# Patient Record
Sex: Female | Born: 1950 | Hispanic: Yes | Marital: Married | Smoking: Never smoker
Health system: Southern US, Community
[De-identification: ages and names within clinical notes are randomized; demographics above are authoritative.]

## PROBLEM LIST (undated history)

## (undated) DIAGNOSIS — F419 Anxiety disorder, unspecified: Secondary | ICD-10-CM

## (undated) DIAGNOSIS — I10 Essential (primary) hypertension: Secondary | ICD-10-CM

## (undated) DIAGNOSIS — E7800 Pure hypercholesterolemia, unspecified: Secondary | ICD-10-CM

## (undated) DIAGNOSIS — K219 Gastro-esophageal reflux disease without esophagitis: Secondary | ICD-10-CM

## (undated) DIAGNOSIS — Z923 Personal history of irradiation: Secondary | ICD-10-CM

## (undated) DIAGNOSIS — E785 Hyperlipidemia, unspecified: Secondary | ICD-10-CM

## (undated) HISTORY — DX: Essential (primary) hypertension: I10

## (undated) HISTORY — PX: TUBAL LIGATION: SHX77

## (undated) HISTORY — PX: CATARACT EXTRACTION, BILATERAL: SHX1313

## (undated) HISTORY — DX: Pure hypercholesterolemia, unspecified: E78.00

## (undated) HISTORY — DX: Gastro-esophageal reflux disease without esophagitis: K21.9

---

## 2005-05-25 ENCOUNTER — Ambulatory Visit: Payer: Self-pay | Admitting: Family Medicine

## 2006-06-23 ENCOUNTER — Ambulatory Visit: Payer: Self-pay | Admitting: Family Medicine

## 2007-02-02 ENCOUNTER — Ambulatory Visit: Payer: Self-pay | Admitting: Family Medicine

## 2007-04-28 ENCOUNTER — Ambulatory Visit: Payer: Self-pay | Admitting: Unknown Physician Specialty

## 2008-02-29 ENCOUNTER — Ambulatory Visit: Payer: Self-pay | Admitting: Internal Medicine

## 2009-02-11 ENCOUNTER — Ambulatory Visit: Payer: Self-pay | Admitting: Internal Medicine

## 2010-02-12 ENCOUNTER — Ambulatory Visit: Payer: Self-pay | Admitting: Internal Medicine

## 2011-02-16 ENCOUNTER — Ambulatory Visit: Payer: Self-pay | Admitting: Internal Medicine

## 2012-03-02 ENCOUNTER — Ambulatory Visit: Payer: Self-pay | Admitting: Internal Medicine

## 2013-03-20 ENCOUNTER — Ambulatory Visit: Payer: Self-pay | Admitting: Internal Medicine

## 2014-03-11 DIAGNOSIS — I10 Essential (primary) hypertension: Secondary | ICD-10-CM

## 2014-03-11 HISTORY — DX: Essential (primary) hypertension: I10

## 2014-03-25 ENCOUNTER — Ambulatory Visit: Payer: Self-pay | Admitting: Internal Medicine

## 2015-01-24 ENCOUNTER — Other Ambulatory Visit: Payer: Self-pay | Admitting: Internal Medicine

## 2015-01-24 DIAGNOSIS — Z1231 Encounter for screening mammogram for malignant neoplasm of breast: Secondary | ICD-10-CM

## 2015-03-31 ENCOUNTER — Ambulatory Visit
Admission: RE | Admit: 2015-03-31 | Discharge: 2015-03-31 | Disposition: A | Discharge status: Home / Self Care | Payer: BLUE CROSS/BLUE SHIELD | Source: Ambulatory Visit | Attending: Internal Medicine | Admitting: Internal Medicine

## 2015-03-31 DIAGNOSIS — Z1231 Encounter for screening mammogram for malignant neoplasm of breast: Secondary | ICD-10-CM | POA: Diagnosis not present

## 2015-05-21 DIAGNOSIS — K219 Gastro-esophageal reflux disease without esophagitis: Secondary | ICD-10-CM

## 2015-05-21 HISTORY — DX: Gastro-esophageal reflux disease without esophagitis: K21.9

## 2016-02-20 ENCOUNTER — Other Ambulatory Visit: Payer: Self-pay | Admitting: Internal Medicine

## 2016-02-23 ENCOUNTER — Other Ambulatory Visit: Payer: Self-pay | Admitting: Internal Medicine

## 2016-02-23 DIAGNOSIS — Z1231 Encounter for screening mammogram for malignant neoplasm of breast: Secondary | ICD-10-CM

## 2016-04-01 ENCOUNTER — Other Ambulatory Visit: Payer: Self-pay | Admitting: Internal Medicine

## 2016-04-01 ENCOUNTER — Ambulatory Visit
Admission: RE | Admit: 2016-04-01 | Discharge: 2016-04-01 | Disposition: A | Discharge status: Home / Self Care | Payer: BLUE CROSS/BLUE SHIELD | Source: Ambulatory Visit | Attending: Internal Medicine | Admitting: Internal Medicine

## 2016-04-01 DIAGNOSIS — Z1231 Encounter for screening mammogram for malignant neoplasm of breast: Secondary | ICD-10-CM | POA: Insufficient documentation

## 2016-09-04 ENCOUNTER — Encounter: Payer: Self-pay | Admitting: Emergency Medicine

## 2016-09-04 ENCOUNTER — Emergency Department
Admission: EM | Admit: 2016-09-04 | Discharge: 2016-09-04 | Disposition: A | Discharge status: Home / Self Care | Payer: BLUE CROSS/BLUE SHIELD | Attending: Emergency Medicine | Admitting: Emergency Medicine

## 2016-09-04 DIAGNOSIS — I10 Essential (primary) hypertension: Secondary | ICD-10-CM | POA: Insufficient documentation

## 2016-09-04 DIAGNOSIS — R1013 Epigastric pain: Secondary | ICD-10-CM | POA: Diagnosis not present

## 2016-09-04 DIAGNOSIS — R112 Nausea with vomiting, unspecified: Secondary | ICD-10-CM | POA: Insufficient documentation

## 2016-09-04 HISTORY — DX: Essential (primary) hypertension: I10

## 2016-09-04 LAB — COMPREHENSIVE METABOLIC PANEL WITH GFR
ALT: 16 U/L (ref 14–54)
AST: 24 U/L (ref 15–41)
Albumin: 4.4 g/dL (ref 3.5–5.0)
Alkaline Phosphatase: 84 U/L (ref 38–126)
Anion gap: 12 (ref 5–15)
BUN: 14 mg/dL (ref 6–20)
CO2: 27 mmol/L (ref 22–32)
Calcium: 9.7 mg/dL (ref 8.9–10.3)
Chloride: 99 mmol/L — ABNORMAL LOW (ref 101–111)
Creatinine, Ser: 0.69 mg/dL (ref 0.44–1.00)
GFR calc Af Amer: >60 mL/min
GFR calc non Af Amer: >60 mL/min
Glucose, Bld: 133 mg/dL — ABNORMAL HIGH (ref 65–99)
Potassium: 3.6 mmol/L (ref 3.5–5.1)
Sodium: 138 mmol/L (ref 135–145)
Total Bilirubin: 0.4 mg/dL (ref 0.3–1.2)
Total Protein: 8.4 g/dL — ABNORMAL HIGH (ref 6.5–8.1)

## 2016-09-04 LAB — URINALYSIS COMPLETE WITH MICROSCOPIC (ARMC ONLY)
BILIRUBIN URINE: NEGATIVE
GLUCOSE, UA: NEGATIVE mg/dL
HGB URINE DIPSTICK: NEGATIVE
Nitrite: NEGATIVE
PH: 8 (ref 5.0–8.0)
Protein, ur: 100 mg/dL — AB
Specific Gravity, Urine: 1.014 (ref 1.005–1.030)

## 2016-09-04 LAB — CBC
HCT: 41.6 % (ref 35.0–47.0)
Hemoglobin: 14.7 g/dL (ref 12.0–16.0)
MCH: 31.5 pg (ref 26.0–34.0)
MCHC: 35.3 g/dL (ref 32.0–36.0)
MCV: 89.4 fL (ref 80.0–100.0)
PLATELETS: 329 10*3/uL (ref 150–440)
RBC: 4.65 MIL/uL (ref 3.80–5.20)
RDW: 13.8 % (ref 11.5–14.5)
WBC: 8.8 10*3/uL (ref 3.6–11.0)

## 2016-09-04 LAB — TROPONIN I: Troponin I: <0.03 ng/mL (ref <0.03)

## 2016-09-04 LAB — LIPASE, BLOOD: Lipase: 18 U/L (ref 11–51)

## 2016-09-04 MED ORDER — ONDANSETRON 4 MG PO TBDP: 4.0000 mg | ORAL_TABLET | Freq: Three times a day (TID) | ORAL | 0 refills | Status: DC | PRN

## 2016-09-04 MED ORDER — ONDANSETRON 4 MG PO TBDP
4.0000 mg | ORAL_TABLET | Freq: Once | ORAL | Status: AC | PRN
  Administered 2016-09-04: 4 mg via ORAL
  Filled 2016-09-04: qty 1

## 2016-09-04 MED ORDER — MORPHINE SULFATE (PF) 4 MG/ML IV SOLN
4.0000 mg | Freq: Once | INTRAVENOUS | Status: AC
  Administered 2016-09-04: 4 mg via INTRAVENOUS
  Filled 2016-09-04: qty 1

## 2016-09-04 MED ORDER — ONDANSETRON HCL 4 MG/2ML IJ SOLN
4.0000 mg | Freq: Once | INTRAMUSCULAR | Status: AC
  Administered 2016-09-04: 4 mg via INTRAVENOUS
  Filled 2016-09-04: qty 2

## 2016-09-04 MED ORDER — SODIUM CHLORIDE 0.9 % IV BOLUS (SEPSIS)
1000.0000 mL | Freq: Once | INTRAVENOUS | Status: AC
  Administered 2016-09-04: 1000 mL via INTRAVENOUS

## 2016-09-04 NOTE — ED Triage Notes (Signed)
Pt does report some shortness of breath at times that comes and goes.

## 2016-09-04 NOTE — ED Provider Notes (Addendum)
[facility] Emergency Department Provider Note  Time seen: 8:20 PM  I have reviewed the triage vital signs and the nursing notes.   HISTORY  Chief Complaint Abdominal Pain and Nausea    HPI Rebekah Kelly is a 65 y.o. female with a past medical history of hypertension who presents the emergency department with with upper abdominal discomfort nausea and vomiting. According to the patient around 2 or 3 PM this afternoon the patient developed upper abdominal discomfort, became nauseated and had an episode of vomiting. States she continued to feel somewhat nauseated so she came to the emergency department for evaluation. States the pain has subsided, she continues to feel mildly nauseated here denies any fever. Denies dysuria. Denies diarrhea. Patient states the pain is gone at this time.   Past Medical History:  Diagnosis Date  . Hypertension     There are no active problems to display for this patient.   History reviewed. No pertinent surgical history.  Prior to Admission medications   Not on File    No Known Allergies  Family History  Problem Relation Age of Onset  . Breast cancer Neg Hx     Social History Social History  Substance Use Topics  . Smoking status: Never Smoker  . Smokeless tobacco: Never Used  . Alcohol use No    Review of Systems Constitutional: Negative for fever. Cardiovascular: Negative for chest pain. Respiratory: Negative for shortness of breath. Gastrointestinal:Epigastric pain, now resolved. Positive for nausea. One episode of vomiting. Negative for diarrhea.  Genitourinary: Negative for dysuria. Neurological: Negative for headache 10-point ROS otherwise negative.  ____________________________________________   PHYSICAL EXAM:  VITAL SIGNS: ED Triage Vitals  Enc Vitals Group     BP 09/04/16 1723 (!) 199/107     Pulse Rate 09/04/16 1723 (!) 112     Resp 09/04/16 1723 (!) 22     Temp 09/04/16 1723 97.4 F (36.3  C)     Temp Source 09/04/16 1723 Oral     SpO2 09/04/16 1723 99 %     Weight 09/04/16 1723 166 lb (75.3 kg)     Height 09/04/16 1723 5\' 4"  (1.626 m)     Head Circumference --      Peak Flow --      Pain Score 09/04/16 1727 10     Pain Loc --      Pain Edu? --      Excl. in [place]? --     Constitutional: Alert and oriented. Well appearing and in no distress. Eyes: Normal exam ENT   Head: Normocephalic and atraumatic   Mouth/Throat: Mucous membranes are moist. Cardiovascular: Normal rate, regular rhythm. No murmur Respiratory: Normal respiratory effort without tachypnea nor retractions. Breath sounds are clear Gastrointestinal: Soft and nontender. No distention.   Musculoskeletal: Nontender with normal range of motion in all extremities.  Neurologic:  No gross focal neurologic deficits Skin:  Skin is warm, dry and intact.  Psychiatric: Mood and affect are normal.   ____________________________________________    INITIAL IMPRESSION / ASSESSMENT AND PLAN / ED COURSE  Pertinent labs & imaging results that were available during my care of the patient were reviewed by me and considered in my medical decision making (see chart for details).  The patient presents the emergency department epigastric discomfort nausea and vomiting. Hospital interpreter used for this evaluation. On exam the patient has a completely nontender abdomen with special attention. To the epigastrium and right upper quadrant without any pain or tenderness elicited.  Patient states the pain is gone, mild nausea. Patient denies any diarrhea. Denies any fever. Patient's labs are normal including lipase and LFTs. We will IV hydrate, treat with Zofran and closely monitor in the emergency department. I suspect the patient likely is experiencing a gastric intestinal illness possibly viral gastroenteritis.   EKG reviewed and interpreted, social sinus tachycardia 113 bpm, narrow QRS, normal axis, normal intervals besides a  slightly prolonged QTC of 507 ms, no concerning ST changes.  Patient states moderate headache, states the abdominal pain has completely resolved. We'll treat with pain medication, monitor closely in the emergency department. Plan to discharge home with PCP follow-up regarding her blood pressure. I discussed the plan of care with the patient with the interpreter present. ____________________________________________   FINAL CLINICAL IMPRESSION(S) / ED DIAGNOSES  Nausea and vomiting    Harvest Dark, MD 09/04/16 [id number]    Harvest Dark, MD 09/04/16 2204

## 2016-09-04 NOTE — ED Triage Notes (Signed)
Pt presents to ED with c/o epigastric abdominal pain that started around 330pm with reports of acid reflux.  Pt does not take any medications for GERD.  Pt reports vomiting 3x today.  Denies any diarrhea, but reports nausea.  Pt states she ate some spaghetti around 12pm.

## 2016-09-04 NOTE — ED Notes (Signed)
Interpreter requested 

## 2016-09-04 NOTE — Discharge Instructions (Signed)
Please take and nausea medication as needed, as prescribed. Please follow-up with your primary care doctor in the next 2-3 days for recheck/reevaluation. Return to the emergency department for any abdominal pain, fever, or any other symptom personally concerning to yourself.

## 2016-09-20 DIAGNOSIS — E7800 Pure hypercholesterolemia, unspecified: Secondary | ICD-10-CM

## 2016-09-20 HISTORY — DX: Pure hypercholesterolemia, unspecified: E78.00

## 2016-10-11 DIAGNOSIS — Z923 Personal history of irradiation: Secondary | ICD-10-CM

## 2016-10-11 HISTORY — DX: Personal history of irradiation: Z92.3

## 2017-02-25 ENCOUNTER — Encounter: Payer: Self-pay | Admitting: *Deleted

## 2017-02-28 ENCOUNTER — Ambulatory Visit: Payer: BLUE CROSS/BLUE SHIELD | Admitting: Anesthesiology

## 2017-02-28 ENCOUNTER — Encounter: Payer: Self-pay | Admitting: *Deleted

## 2017-02-28 ENCOUNTER — Encounter
Admission: RE | Disposition: A | Discharge status: Home / Self Care | Payer: Self-pay | Source: Ambulatory Visit | Attending: Unknown Physician Specialty

## 2017-02-28 ENCOUNTER — Ambulatory Visit
Admission: RE | Admit: 2017-02-28 | Discharge: 2017-02-28 | Disposition: A | Discharge status: Home / Self Care | Payer: BLUE CROSS/BLUE SHIELD | Source: Ambulatory Visit | Attending: Unknown Physician Specialty | Admitting: Unknown Physician Specialty

## 2017-02-28 DIAGNOSIS — I10 Essential (primary) hypertension: Secondary | ICD-10-CM | POA: Insufficient documentation

## 2017-02-28 DIAGNOSIS — Z9851 Tubal ligation status: Secondary | ICD-10-CM | POA: Insufficient documentation

## 2017-02-28 DIAGNOSIS — Z1211 Encounter for screening for malignant neoplasm of colon: Secondary | ICD-10-CM | POA: Insufficient documentation

## 2017-02-28 DIAGNOSIS — K648 Other hemorrhoids: Secondary | ICD-10-CM | POA: Insufficient documentation

## 2017-02-28 DIAGNOSIS — K449 Diaphragmatic hernia without obstruction or gangrene: Secondary | ICD-10-CM | POA: Insufficient documentation

## 2017-02-28 DIAGNOSIS — Z7982 Long term (current) use of aspirin: Secondary | ICD-10-CM | POA: Diagnosis not present

## 2017-02-28 DIAGNOSIS — K2950 Unspecified chronic gastritis without bleeding: Secondary | ICD-10-CM | POA: Insufficient documentation

## 2017-02-28 DIAGNOSIS — K219 Gastro-esophageal reflux disease without esophagitis: Secondary | ICD-10-CM | POA: Insufficient documentation

## 2017-02-28 DIAGNOSIS — E785 Hyperlipidemia, unspecified: Secondary | ICD-10-CM | POA: Diagnosis not present

## 2017-02-28 HISTORY — PX: ESOPHAGOGASTRODUODENOSCOPY (EGD) WITH PROPOFOL: SHX5813

## 2017-02-28 HISTORY — DX: Hyperlipidemia, unspecified: E78.5

## 2017-02-28 HISTORY — PX: COLONOSCOPY WITH PROPOFOL: SHX5780

## 2017-02-28 SURGERY — COLONOSCOPY WITH PROPOFOL
Anesthesia: General

## 2017-02-28 MED ORDER — GLYCOPYRROLATE 0.2 MG/ML IJ SOLN
INTRAMUSCULAR | Status: DC | PRN
  Administered 2017-02-28: 0.2 mg via INTRAVENOUS

## 2017-02-28 MED ORDER — PROPOFOL 500 MG/50ML IV EMUL
INTRAVENOUS | Status: DC | PRN
  Administered 2017-02-28: 150 ug/kg/min via INTRAVENOUS

## 2017-02-28 MED ORDER — LABETALOL HCL 5 MG/ML IV SOLN
INTRAVENOUS | Status: DC | PRN
  Administered 2017-02-28: 10 mg via INTRAVENOUS

## 2017-02-28 MED ORDER — SODIUM CHLORIDE 0.9 % IV SOLN
INTRAVENOUS | Status: DC
  Administered 2017-02-28 (×2): via INTRAVENOUS

## 2017-02-28 MED ORDER — GLYCOPYRROLATE 0.2 MG/ML IJ SOLN
INTRAMUSCULAR | Status: AC
  Filled 2017-02-28: qty 1

## 2017-02-28 MED ORDER — PROPOFOL 500 MG/50ML IV EMUL
INTRAVENOUS | Status: AC
  Filled 2017-02-28: qty 50

## 2017-02-28 MED ORDER — LIDOCAINE HCL (CARDIAC) 20 MG/ML IV SOLN
INTRAVENOUS | Status: DC | PRN
  Administered 2017-02-28: 4 mL via INTRAVENOUS

## 2017-02-28 MED ORDER — PROPOFOL 10 MG/ML IV BOLUS
INTRAVENOUS | Status: DC | PRN
  Administered 2017-02-28: 50 mg via INTRAVENOUS
  Administered 2017-02-28 (×2): 25 mg via INTRAVENOUS

## 2017-02-28 NOTE — Transfer of Care (Signed)
Immediate Anesthesia Transfer of Care Note  Patient: Rebekah Kelly  Procedure(s) Performed: Procedure(s): COLONOSCOPY WITH PROPOFOL (N/A) ESOPHAGOGASTRODUODENOSCOPY (EGD) WITH PROPOFOL (N/A)  Patient Location: PACU  Anesthesia Type:General  Level of Consciousness: sedated and responds to stimulation  Airway & Oxygen Therapy: Patient Spontanous Breathing and Patient connected to nasal cannula oxygen  Post-op Assessment: Report given to RN and Post -op Vital signs reviewed and stable  Post vital signs: Reviewed and stable  Last Vitals:  Vitals:   02/28/17 1247 02/28/17 1437  BP: (!) 252/106 122/61  Pulse: 94 99  Resp: 18 15  Temp: 37.2 C     Last Pain:  Vitals:   02/28/17 1247  TempSrc: Tympanic         Complications: No apparent anesthesia complications

## 2017-02-28 NOTE — Anesthesia Post-op Follow-up Note (Cosign Needed)
Anesthesia QCDR form completed.        

## 2017-02-28 NOTE — H&P (Signed)
   Primary Care Physician:  Tracie Harrier, MD Primary Gastroenterologist:  Dr. Vira Agar  Pre-Procedure History & Physical: HPI:  Rebekah Kelly is a 66 y.o. female is here for an endoscopy and colonoscopy.   Past Medical History:  Diagnosis Date  . Hyperlipidemia   . Hypertension     Past Surgical History:  Procedure Laterality Date  . TUBAL LIGATION      Prior to Admission medications   Medication Sig Start Date End Date Taking? Authorizing Provider  aspirin EC 81 MG tablet Take 81 mg by mouth daily.   Yes [provider]  lisinopril-hydrochlorothiazide (PRINZIDE,ZESTORETIC) 10-12.5 MG tablet Take 1 tablet by mouth daily.   Yes [provider]  ondansetron (ZOFRAN ODT) 4 MG disintegrating tablet Take 1 tablet (4 mg total) by mouth every 8 (eight) hours as needed for nausea or vomiting. Patient not taking: Reported on 02/28/2017 09/04/16   Harvest Dark, MD    Allergies as of 12/15/2016  . (No Known Allergies)    Family History  Problem Relation Age of Onset  . Breast cancer Neg Hx     Social History   Social History  . Marital status: Married    Spouse name: N/A  . Number of children: N/A  . Years of education: N/A   Occupational History  . Not on file.   Social History Main Topics  . Smoking status: Never Smoker  . Smokeless tobacco: Never Used  . Alcohol use No  . Drug use: No  . Sexual activity: Not on file   Other Topics Concern  . Not on file   Social History Narrative  . No narrative on file    Review of Systems: See HPI, otherwise negative ROS  Physical Exam: BP 140/82   Pulse 97   Temp (!) 96.5 F (35.8 C) (Tympanic)   Resp 11   Ht 5' (1.524 m)   Wt 75.8 kg (167 lb)   SpO2 100%   BMI 32.61 kg/m  General:   Alert,  pleasant and cooperative in NAD Head:  Normocephalic and atraumatic. Neck:  Supple; no masses or thyromegaly. Lungs:  Clear throughout to auscultation.    Heart:  Regular rate  and rhythm. Abdomen:  Soft, nontender and nondistended. Normal bowel sounds, without guarding, and without rebound.   Neurologic:  Alert and  oriented x4;  grossly normal neurologically.  Impression/Plan: Rebekah Kelly is here for an endoscopy and colonoscopy to be performed for screening and dyspepsia  Risks, benefits, limitations, and alternatives regarding  endoscopy and colonoscopy have been reviewed with the patient.  Questions have been answered.  All parties agreeable.   Gaylyn Cheers, MD  02/28/2017, 2:54 PM

## 2017-02-28 NOTE — Anesthesia Preprocedure Evaluation (Signed)
Anesthesia Evaluation  Patient identified by MRN, date of birth, ID band Patient awake    Reviewed: Allergy & Precautions, NPO status , Patient's Chart, lab work & pertinent test results  History of Anesthesia Complications Negative for: history of anesthetic complications  Airway Mallampati: III       Dental   Pulmonary neg pulmonary ROS,           Cardiovascular hypertension, Pt. on medications      Neuro/Psych negative neurological ROS     GI/Hepatic Neg liver ROS, GERD  ,  Endo/Other  negative endocrine ROS  Renal/GU negative Renal ROS     Musculoskeletal   Abdominal   Peds  Hematology   Anesthesia Other Findings   Reproductive/Obstetrics                             Anesthesia Physical Anesthesia Plan  ASA: II  Anesthesia Plan: General   Post-op Pain Management:    Induction: Intravenous  Airway Management Planned: Nasal Cannula  Additional Equipment:   Intra-op Plan:   Post-operative Plan:   Informed Consent: I have reviewed the patients History and Physical, chart, labs and discussed the procedure including the risks, benefits and alternatives for the proposed anesthesia with the patient or authorized representative who has indicated his/her understanding and acceptance.     Plan Discussed with:   Anesthesia Plan Comments:         Anesthesia Quick Evaluation

## 2017-02-28 NOTE — Op Note (Signed)
[facility] Gastroenterology Patient Name: Rebekah Kelly Procedure Date: 02/28/2017 2:04 PM MRN: [id number] Account #: [id number] Date of Birth: 1951/05/08 Admit Type: Outpatient Age: 66 Room: [facility] ENDO ROOM 1 Gender: Female Note Status: Finalized Procedure:            Colonoscopy Indications:          Screening for colorectal malignant neoplasm Providers:            Manya Silvas, MD Referring MD:         Tracie Harrier, MD (Referring MD) Medicines:            Propofol per Anesthesia Complications:        No immediate complications. Procedure:            Pre-Anesthesia Assessment:                       - After reviewing the risks and benefits, the patient                        was deemed in satisfactory condition to undergo the                        procedure.                       After obtaining informed consent, the colonoscope was                        passed under direct vision. Throughout the procedure,                        the patient's blood pressure, pulse, and oxygen                        saturations were monitored continuously. The Olympus                        PCF-H180AL colonoscope ( S#: [id number] ) was introduced                        through the anus and advanced to the the cecum,                        identified by appendiceal orifice and ileocecal valve.                        The colonoscopy was performed without difficulty. The                        patient tolerated the procedure well. The quality of                        the bowel preparation was adequate to identify polyps. Findings:      Internal hemorrhoids were found during endoscopy. The hemorrhoids were       small and Grade I (internal hemorrhoids that do not prolapse). Some       liquid stool was suctioned out of the colon, enough to get a good view       of all the colon. No polyps or inflammation seen anywhere. Impression:           -  Internal  hemorrhoids.                       - No specimens collected. Recommendation:       - Repeat colonoscopy in 10 years for screening purposes. Manya Silvas, MD 02/28/2017 2:38:42 PM This report has been signed electronically. Number of Addenda: 0 Note Initiated On: 02/28/2017 2:04 PM Scope Withdrawal Time: 0 hours 7 minutes 58 seconds  Total Procedure Duration: 0 hours 13 minutes 34 seconds       [facility]

## 2017-02-28 NOTE — Op Note (Signed)
[facility] Gastroenterology Patient Name: Rebekah Kelly Procedure Date: 02/28/2017 2:04 PM MRN: [id number] Account #: [id number] Date of Birth: 04-24-1951 Admit Type: Outpatient Age: 66 Room: [facility] ENDO ROOM 1 Gender: Female Note Status: Finalized Procedure:            Upper GI endoscopy Indications:          Heartburn Providers:            Manya Silvas, MD Referring MD:         Tracie Harrier, MD (Referring MD) Medicines:            Propofol per Anesthesia Complications:        No immediate complications. Procedure:            Pre-Anesthesia Assessment:                       - After reviewing the risks and benefits, the patient                        was deemed in satisfactory condition to undergo the                        procedure.                       After obtaining informed consent, the endoscope was                        passed under direct vision. Throughout the procedure,                        the patient's blood pressure, pulse, and oxygen                        saturations were monitored continuously. The Endoscope                        was introduced through the mouth, and advanced to the                        second part of duodenum. The upper GI endoscopy was                        accomplished without difficulty. The patient tolerated                        the procedure well. Findings:      The examined esophagus was normal.      A small hiatal hernia was present.      Diffuse mildly slightly erythematous mucosa without bleeding was found       in the gastric body. Biopsies were taken with a cold forceps for       Helicobacter pylori testing. Small submucosal nodule seen in proximal       body.      The examined duodenum was normal. Impression:           - Normal esophagus.                       - Small hiatal hernia.                       -  Erythematous mucosa in the gastric body. Biopsied.                       -  Normal examined duodenum. Recommendation:       - Await pathology results. Manya Silvas, MD 02/28/2017 2:19:02 PM This report has been signed electronically. Number of Addenda: 0 Note Initiated On: 02/28/2017 2:04 PM      [facility]

## 2017-03-02 LAB — SURGICAL PATHOLOGY

## 2017-03-03 ENCOUNTER — Encounter: Payer: Self-pay | Admitting: Unknown Physician Specialty

## 2017-03-03 NOTE — Anesthesia Postprocedure Evaluation (Signed)
Anesthesia Post Note  Patient: Nonie Lochner de Crespo  Procedure(s) Performed: Procedure(s) (LRB): COLONOSCOPY WITH PROPOFOL (N/A) ESOPHAGOGASTRODUODENOSCOPY (EGD) WITH PROPOFOL (N/A)  Patient location during evaluation: Endoscopy Anesthesia Type: General Level of consciousness: awake and alert Pain management: pain level controlled Vital Signs Assessment: post-procedure vital signs reviewed and stable Respiratory status: spontaneous breathing, nonlabored ventilation, respiratory function stable and patient connected to nasal cannula oxygen Cardiovascular status: blood pressure returned to baseline and stable Postop Assessment: no signs of nausea or vomiting Anesthetic complications: no     Last Vitals:  Vitals:   02/28/17 1505 02/28/17 1515  BP: (!) 157/83 (!) 154/75  Pulse: 92 90  Resp: 11 11  Temp:      Last Pain:  Vitals:   02/28/17 1437  TempSrc: Tympanic  PainSc: Asleep                 Martha Clan

## 2017-03-24 ENCOUNTER — Other Ambulatory Visit: Payer: Self-pay | Admitting: Internal Medicine

## 2017-03-24 DIAGNOSIS — Z1231 Encounter for screening mammogram for malignant neoplasm of breast: Secondary | ICD-10-CM

## 2017-04-20 ENCOUNTER — Ambulatory Visit
Admission: RE | Admit: 2017-04-20 | Discharge: 2017-04-20 | Disposition: A | Discharge status: Home / Self Care | Payer: BLUE CROSS/BLUE SHIELD | Source: Ambulatory Visit | Attending: Internal Medicine | Admitting: Internal Medicine

## 2017-04-20 DIAGNOSIS — Z1231 Encounter for screening mammogram for malignant neoplasm of breast: Secondary | ICD-10-CM | POA: Diagnosis not present

## 2017-04-26 ENCOUNTER — Other Ambulatory Visit: Payer: Self-pay | Admitting: Internal Medicine

## 2017-04-26 DIAGNOSIS — R921 Mammographic calcification found on diagnostic imaging of breast: Secondary | ICD-10-CM

## 2017-04-26 DIAGNOSIS — R928 Other abnormal and inconclusive findings on diagnostic imaging of breast: Secondary | ICD-10-CM

## 2017-04-28 ENCOUNTER — Ambulatory Visit
Admission: RE | Admit: 2017-04-28 | Discharge: 2017-04-28 | Disposition: A | Discharge status: Home / Self Care | Payer: BLUE CROSS/BLUE SHIELD | Source: Ambulatory Visit | Attending: Internal Medicine | Admitting: Internal Medicine

## 2017-04-28 ENCOUNTER — Other Ambulatory Visit: Payer: BLUE CROSS/BLUE SHIELD

## 2017-04-28 ENCOUNTER — Other Ambulatory Visit: Payer: Self-pay | Admitting: Internal Medicine

## 2017-04-28 DIAGNOSIS — R921 Mammographic calcification found on diagnostic imaging of breast: Secondary | ICD-10-CM

## 2017-04-28 DIAGNOSIS — R928 Other abnormal and inconclusive findings on diagnostic imaging of breast: Secondary | ICD-10-CM | POA: Diagnosis not present

## 2017-04-29 ENCOUNTER — Other Ambulatory Visit: Payer: Self-pay | Admitting: Internal Medicine

## 2017-04-29 DIAGNOSIS — R921 Mammographic calcification found on diagnostic imaging of breast: Secondary | ICD-10-CM

## 2017-04-29 DIAGNOSIS — R928 Other abnormal and inconclusive findings on diagnostic imaging of breast: Secondary | ICD-10-CM

## 2017-05-06 ENCOUNTER — Ambulatory Visit
Admission: RE | Admit: 2017-05-06 | Discharge: 2017-05-06 | Disposition: A | Discharge status: Home / Self Care | Payer: BLUE CROSS/BLUE SHIELD | Source: Ambulatory Visit | Attending: Internal Medicine | Admitting: Internal Medicine

## 2017-05-06 DIAGNOSIS — R921 Mammographic calcification found on diagnostic imaging of breast: Secondary | ICD-10-CM

## 2017-05-06 DIAGNOSIS — R928 Other abnormal and inconclusive findings on diagnostic imaging of breast: Secondary | ICD-10-CM

## 2017-05-06 DIAGNOSIS — D0511 Intraductal carcinoma in situ of right breast: Secondary | ICD-10-CM | POA: Diagnosis not present

## 2017-05-06 DIAGNOSIS — R920 Mammographic microcalcification found on diagnostic imaging of breast: Secondary | ICD-10-CM | POA: Insufficient documentation

## 2017-05-06 HISTORY — PX: BREAST BIOPSY: SHX20

## 2017-05-10 ENCOUNTER — Encounter: Payer: Self-pay | Admitting: *Deleted

## 2017-05-10 LAB — SURGICAL PATHOLOGY

## 2017-05-10 NOTE — Progress Notes (Signed)
  Oncology Nurse Navigator Documentation  Navigator Location: [facility] (05/10/17 1100)   )Navigator Encounter Type: Introductory phone call (05/10/17 1100)   Abnormal Finding Date: 04/28/17 (05/10/17 1100) Confirmed Diagnosis Date: 05/10/17 (05/10/17 1100)                   Barriers/Navigation Needs:  (language barrier) (05/10/17 1100)                          Time Spent with Patient: 15 (05/10/17 1100)   Patient is newly diagnosed with DCIS of the right breast.  Tried to call patient to establish navigation services via the interpreter, Ronnald Collum.  No answer, and no voicemail.  Will try again later.  Patient will need to be scheduled for a surgical consult.

## 2017-05-11 ENCOUNTER — Encounter: Payer: Self-pay | Admitting: *Deleted

## 2017-05-11 NOTE — Progress Notes (Signed)
  Oncology Nurse Navigator Documentation  Navigator Location: [facility] (05/11/17 1400)   )Navigator Encounter Type: Introductory phone call (05/11/17 1400)                         Barriers/Navigation Needs: Coordination of Care;Education (translation) (05/11/17 1400) Education: Coping with Diagnosis/ Prognosis;Newly Diagnosed Cancer Education (05/11/17 1400) Interventions: Coordination of Care (05/11/17 1400)   Coordination of Care: Appts (interpreter provided) (05/11/17 1400)                  Time Spent with Patient: 30 (05/11/17 1400)   Talked to patient today via [place], the interpreter.  Patient does not have a surgical preference.  She is planning to go to [place] for a trip on 05/18/17 and return on 05/30/17.  I have scheduled her an appointment to see Dr. Hampton Abbot at [facility] on 06/01/17 @ 9:00.  She is aware that I will leave educational literature at his office for her to pick up.  She is to call if she has any questions or needs.

## 2017-05-12 ENCOUNTER — Other Ambulatory Visit: Payer: Self-pay | Admitting: *Deleted

## 2017-05-24 ENCOUNTER — Other Ambulatory Visit: Payer: Self-pay

## 2017-06-01 ENCOUNTER — Ambulatory Visit (INDEPENDENT_AMBULATORY_CARE_PROVIDER_SITE_OTHER): Payer: BLUE CROSS/BLUE SHIELD | Admitting: Surgery

## 2017-06-01 VITALS — BP 193/110 | HR 90 | Temp 98.3°F | Ht 63.0 in | Wt 165.0 lb

## 2017-06-01 DIAGNOSIS — D0511 Intraductal carcinoma in situ of right breast: Secondary | ICD-10-CM | POA: Diagnosis not present

## 2017-06-01 NOTE — Patient Instructions (Signed)
Su cirugia es el 4 de [place] del 2018 con el Doctor Piscoya. Por favor vea el papel azul por si tiene preguntas sobre su [place].

## 2017-06-01 NOTE — Progress Notes (Signed)
06/01/2017  Reason for Visit:  Right breast DCIS  History of Present Illness: Rebekah Kelly is a 66 y.o. female who presents with new diagnosis of right breast DCIS. She had a screening mammography on 7/12 which required additional imaging on 7/19. 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 ER and PR receptors were negative. She presents today for further evaluation.  She denies feeling any lumps or masses during her self exams. He denies having any skin changes or discoloration. Denies any pain prior to the biopsy. Denies any nipple changes or discharge. There is no family history of any cancer.  Past Medical History: Past Medical History:  Diagnosis Date  . GERD without esophagitis 05/21/2015  . HTN (hypertension) 03/11/2014  . Hypercholesterolemia 09/20/2016  . Hyperlipidemia   . Hypertension      Past Surgical History: Past Surgical History:  Procedure Laterality Date  . BREAST BIOPSY Right 05/06/2017   stereo/ path pending  . COLONOSCOPY WITH PROPOFOL N/A 02/28/2017   Procedure: COLONOSCOPY WITH PROPOFOL;  Surgeon: Elliott, Robert T, MD;  Location: [facility] ENDOSCOPY;  Service: Endoscopy;  Laterality: N/A;  . ESOPHAGOGASTRODUODENOSCOPY (EGD) WITH PROPOFOL N/A 02/28/2017   Procedure: ESOPHAGOGASTRODUODENOSCOPY (EGD) WITH PROPOFOL;  Surgeon: Elliott, Robert T, MD;  Location: [facility] ENDOSCOPY;  Service: Endoscopy;  Laterality: N/A;  . TUBAL LIGATION      Home Medications: Prior to Admission medications   Medication Sig Start Date End Date Taking? Authorizing Provider  aspirin EC 81 MG tablet Take 81 mg by mouth daily.    [provider]  lisinopril-hydrochlorothiazide (PRINZIDE,ZESTORETIC) 10-12.5 MG tablet Take 1 tablet by mouth daily.    [provider]  omeprazole (PRILOSEC) 20 MG capsule Take by mouth.  12/09/16 12/09/17  [provider]  ondansetron (ZOFRAN ODT) 4 MG disintegrating tablet Take 1 tablet (4 mg total) by mouth every 8 (eight) hours as needed for nausea or vomiting. Patient not taking: Reported on 02/28/2017 09/04/16   Paduchowski, Kevin, MD  polyethylene glycol powder (GLYCOLAX/MIRALAX) powder As directed for colonic prep. 02/25/17   [provider]    Allergies: No Known Allergies  Social History:  reports that she has never smoked. She has never used smokeless tobacco. She reports that she does not drink alcohol or use drugs.   Family History: Family History  Problem Relation Age of Onset  . Breast cancer Neg Hx     Review of Systems: Review of Systems  Constitutional: Negative for chills and fever.  HENT: Negative for hearing loss.   Eyes: Negative for blurred vision.  Respiratory: Negative for cough.   Cardiovascular: Negative for chest pain.  Gastrointestinal: Negative for abdominal pain, nausea and vomiting.  Genitourinary: Negative for dysuria.  Musculoskeletal: Negative for myalgias.  Skin: Negative for rash.  Neurological: Negative for dizziness.  Psychiatric/Behavioral: Negative for depression.  All other systems reviewed and are negative.   Physical Exam BP (!) 193/110   Pulse 90   Temp 98.3 F (36.8 C) (Oral)   Ht 5' 3" (1.6 m)   Wt 74.8 kg (165 lb)   BMI 29.23 kg/m  CONSTITUTIONAL: No acute distress HEENT:  Normocephalic, atraumatic, extraocular motion intact. NECK: Trachea is midline, and there is no jugular venous distension.  RESPIRATORY:  Lungs are clear, and breath sounds are equal bilaterally. Normal respiratory effort without pathologic use of accessory muscles. CARDIOVASCULAR: Heart   is regular without murmurs, gallops, or rubs. BREAST: Examination of right breast reveals no palpable masses. Patient status post right breast biopsy with mild ecchymosis surrounding the biopsy site. This is dressed with Steri-Strips. Minimal  tenderness at the biopsy site itself. No axillary lymphadenopathy. Left breast and left axilla negative. GI: The abdomen is soft, nondistended, nontender.  MUSCULOSKELETAL:  Normal muscle strength and tone in all four extremities.  No peripheral edema or cyanosis. SKIN: Skin turgor is normal. There are no pathologic skin lesions.  NEUROLOGIC:  Motor and sensation is grossly normal.  Cranial nerves are grossly intact. PSYCH:  Alert and oriented to person, place and time. Affect is normal.  Laboratory Analysis: Biopsy results from 7/27 showed ductal carcinoma in situ, nuclear grade 2 with microcalcifications.  Imaging: Mammogram on 7/12 and 7/19 showed suspicious right breast calcifications over the outer upper quadrant.  Assessment and Plan: This is a 66 y.o. female who presents with new diagnosis of right breast DCIS.  Given the findings, discussed with the patient the need for surgical excision. Currently given that these are microcalcifications, would require a needle localized excisional biopsy. Currently given that pathology shows DCIS only, would not require any sentinel node biopsy and only an excisional biopsy of the mass. Discussed with the patient currently we know that she has DCIS, however, there is a percentage of patients that despite of this pathology finding, may also have invasive cancer on the final excision. Discussed with the patient risks and benefits of the procedure including risk of bleeding, infection, injury to surrounding structures, need for further procedures. The patient is willing to proceed. Pending on the results from pathology after the excision, she would require referral to radiation oncology +/- medical oncology.  Face-to-face time spent with the patient and care providers was 45 minutes, with more than 50% of the time spent counseling, educating, and coordinating care of the patient.     Jyla Hopf Luis Jerard Bays, MD [facility]    

## 2017-06-02 ENCOUNTER — Telehealth: Payer: Self-pay

## 2017-06-02 DIAGNOSIS — D0511 Intraductal carcinoma in situ of right breast: Secondary | ICD-10-CM

## 2017-06-02 NOTE — Telephone Encounter (Addendum)
Patient scheduled for Right Breast Lumpectomy with Needle Localization on 06/14/17 with Dr. Hampton Abbot at [facility].  PAT is scheduled 06/08/17 at 1030am- Office.  Patient needs to arrive at [facility] for Needle Localization at 0815am. After this procedure is complete, they will take her to the surgery department.

## 2017-06-03 ENCOUNTER — Other Ambulatory Visit: Payer: Self-pay | Admitting: Surgery

## 2017-06-03 DIAGNOSIS — D0511 Intraductal carcinoma in situ of right breast: Secondary | ICD-10-CM

## 2017-06-03 NOTE — Telephone Encounter (Signed)
Called patient to let her know about her PAT and [facility] Appointments. Patient understood and had no further questions.

## 2017-06-03 NOTE — Telephone Encounter (Deleted)
Patient needs to arrive at [facility] for Needle Localization at 0815am. After this procedure is complete, they will take her to the surgery department.

## 2017-06-08 ENCOUNTER — Encounter
Admission: RE | Admit: 2017-06-08 | Discharge: 2017-06-08 | Disposition: A | Discharge status: Home / Self Care | Payer: BLUE CROSS/BLUE SHIELD | Source: Ambulatory Visit | Attending: Surgery | Admitting: Surgery

## 2017-06-08 DIAGNOSIS — Z01810 Encounter for preprocedural cardiovascular examination: Secondary | ICD-10-CM | POA: Diagnosis present

## 2017-06-08 DIAGNOSIS — Z01812 Encounter for preprocedural laboratory examination: Secondary | ICD-10-CM | POA: Diagnosis not present

## 2017-06-08 DIAGNOSIS — E7800 Pure hypercholesterolemia, unspecified: Secondary | ICD-10-CM | POA: Insufficient documentation

## 2017-06-08 DIAGNOSIS — D0511 Intraductal carcinoma in situ of right breast: Secondary | ICD-10-CM | POA: Insufficient documentation

## 2017-06-08 DIAGNOSIS — I10 Essential (primary) hypertension: Secondary | ICD-10-CM | POA: Diagnosis not present

## 2017-06-08 DIAGNOSIS — K219 Gastro-esophageal reflux disease without esophagitis: Secondary | ICD-10-CM | POA: Diagnosis not present

## 2017-06-08 HISTORY — DX: Anxiety disorder, unspecified: F41.9

## 2017-06-08 LAB — POTASSIUM: POTASSIUM: 3.8 mmol/L (ref 3.5–5.1)

## 2017-06-08 NOTE — Patient Instructions (Addendum)
Your procedure is scheduled [id number] 4, 2018 (TUESDAY) Su procedimiento est programado para: Report to [place]. Presntese a: ARRIVAL TIME 8:00 AM.   Remember: Instructions that are not followed completely may result in serious medical risk, up to and including death, or upon the discretion of your surgeon and anesthesiologist your surgery may need to be rescheduled.  Recuerde: Las instrucciones que no se siguen completamente Heritage manager en un riesgo de salud grave, incluyendo hasta la [place] o a discrecin de su cirujano y Environmental health practitioner, su ciruga se puede posponer.   _X___ 1. Do not eat food or drink liquids after midnight. No gum chewing or hard candies.  No coma alimentos ni tome lquidos despus de la medianoche.  No mastique chicle ni caramelos  duros.     _X___ 2. No alcohol for 24 hours before or after surgery.    No tome alcohol durante las 24 horas antes ni despus de la [place].   ____ 3. Bring all medications with you on the day of surgery if instructed.    Lleve todos los medicamentos con usted el da de su ciruga si se le ha indicado as.   __X__ 4. Notify your doctor if there is any change in your medical condition (cold, fever,                             infections).    Informe a su mdico si hay algn cambio en su condicin mdica (resfriado, fiebre, infecciones).   Do not wear jewelry, make-up, hairpins, clips or nail polish.  No use joyas, maquillajes, pinzas/ganchos para el cabello ni esmalte de uas.  Do not wear lotions, powders, or perfumes.  No use lociones, polvos o perfumes.    Do not shave 48 hours prior to surgery. Men may shave face and neck.  No se afeite 48 horas antes de la [place].  Los hombres pueden [employer] cara y el cuello.   Do not bring valuables to the hospital.   No lleve objetos [facility] is not responsible for any belongings or valuables.  [facility] no se hace responsable de ningn  tipo de pertenencias u objetos de Geographical information systems officer.               Contacts, dentures or bridgework may not be worn into surgery.  Los lentes de [place], las dentaduras postizas o puentes no se pueden usar en la [place].  Leave your suitcase in the car. After surgery it may be brought to your room.  Deje su maleta en el auto.  Despus de la ciruga podr traerla a su habitacin.  For patients admitted to the hospital, discharge time is determined by your treatment team.  Para los pacientes que sean ingresados al hospital, el tiempo en el cual se le dar de alta es determinado por su                equipo de [place].   Patients discharged the day of surgery will not be allowed to drive home. A los pacientes que se les da de alta el mismo da de la ciruga no se les permitir conducir a [place] representative.   Please read over the following fact sheets that you were given: Por favor [place] hojas de informacin que le dieron:   Coughing and Deep Breathing   ____ Take these medicines the morning of surgery with A SIP OF  WATER:          [employer] medicinas la maana de la ciruga con [place]:  1.   2.   3.   4.       5.  6.  ____ [place] Enema (as directed)          Enema de [place] (segn lo indicado)    __X__ Use CHG Soap as directed          Utilice el jabn de CHG segn lo indicado  ____ Use inhalers on the day of surgery          Use los inhaladores el da de la ciruga  ____ Stop metformin 2 days prior to surgery          Deje de tomar el metformin 2 das antes de la ciruga    ____ Take 1/2 of usual insulin dose the night before surgery and none on the morning of surgery           Tome la mitad de la dosis habitual de insulina la noche antes de la [place] y no tome nada en la maana de la             ciruga  _X___ Stop Coumadin/Plavix/aspirin on  PATIENT HAS STOPPED ASPIRIN          Deje de tomar el Coumadin/Plavix/aspirina el da:  _X__ Stop Anti-inflammatories on OK TO  TYLENOL FOR PAIN , NO ASPIRIN PRODUCTS, EXAMPLE ALEVE, ADVIL, IBUPROFEN, BC'S, GOODYS          Deje de tomar antiinflamatorios el da:   _X___ Stop supplements until after surgery            Deje de tomar suplementos hasta despus de la ciruga  ____ Bring C-Pap to the hospital          [place] al hospital

## 2017-06-11 HISTORY — PX: BREAST LUMPECTOMY: SHX2

## 2017-06-13 MED ORDER — CEFAZOLIN SODIUM-DEXTROSE 2-4 GM/100ML-% IV SOLN
2.0000 g | INTRAVENOUS | Status: AC
  Administered 2017-06-14: 2 g via INTRAVENOUS

## 2017-06-14 ENCOUNTER — Encounter
Admission: RE | Disposition: A | Discharge status: Home / Self Care | Payer: Self-pay | Source: Ambulatory Visit | Attending: Surgery

## 2017-06-14 ENCOUNTER — Ambulatory Visit
Admission: RE | Admit: 2017-06-14 | Discharge: 2017-06-14 | Disposition: A | Discharge status: Home / Self Care | Payer: BLUE CROSS/BLUE SHIELD | Source: Ambulatory Visit | Attending: Surgery | Admitting: Surgery

## 2017-06-14 ENCOUNTER — Encounter: Payer: Self-pay | Admitting: Anesthesiology

## 2017-06-14 ENCOUNTER — Ambulatory Visit: Payer: BLUE CROSS/BLUE SHIELD | Admitting: Certified Registered"

## 2017-06-14 DIAGNOSIS — Z79899 Other long term (current) drug therapy: Secondary | ICD-10-CM | POA: Diagnosis not present

## 2017-06-14 DIAGNOSIS — I10 Essential (primary) hypertension: Secondary | ICD-10-CM | POA: Insufficient documentation

## 2017-06-14 DIAGNOSIS — D0511 Intraductal carcinoma in situ of right breast: Secondary | ICD-10-CM | POA: Diagnosis not present

## 2017-06-14 DIAGNOSIS — Z7982 Long term (current) use of aspirin: Secondary | ICD-10-CM | POA: Diagnosis not present

## 2017-06-14 DIAGNOSIS — E7800 Pure hypercholesterolemia, unspecified: Secondary | ICD-10-CM | POA: Insufficient documentation

## 2017-06-14 DIAGNOSIS — K219 Gastro-esophageal reflux disease without esophagitis: Secondary | ICD-10-CM | POA: Insufficient documentation

## 2017-06-14 HISTORY — PX: BREAST LUMPECTOMY WITH NEEDLE LOCALIZATION: SHX5759

## 2017-06-14 HISTORY — PX: BREAST EXCISIONAL BIOPSY: SUR124

## 2017-06-14 SURGERY — BREAST LUMPECTOMY WITH NEEDLE LOCALIZATION
Anesthesia: General | Laterality: Right | Wound class: Clean

## 2017-06-14 MED ORDER — MIDAZOLAM HCL 2 MG/2ML IJ SOLN
INTRAMUSCULAR | Status: DC | PRN
  Administered 2017-06-14: 2 mg via INTRAVENOUS

## 2017-06-14 MED ORDER — DEXAMETHASONE SODIUM PHOSPHATE 10 MG/ML IJ SOLN
INTRAMUSCULAR | Status: DC | PRN
  Administered 2017-06-14: 4 mg via INTRAVENOUS

## 2017-06-14 MED ORDER — PROPOFOL 10 MG/ML IV BOLUS
INTRAVENOUS | Status: AC
  Filled 2017-06-14: qty 20

## 2017-06-14 MED ORDER — CHLORHEXIDINE GLUCONATE CLOTH 2 % EX PADS: 6.0000 | MEDICATED_PAD | Freq: Once | CUTANEOUS | Status: DC

## 2017-06-14 MED ORDER — ONDANSETRON HCL 4 MG/2ML IJ SOLN: 4.0000 mg | Freq: Once | INTRAMUSCULAR | Status: DC | PRN

## 2017-06-14 MED ORDER — MIDAZOLAM HCL 2 MG/2ML IJ SOLN
INTRAMUSCULAR | Status: AC
  Filled 2017-06-14: qty 2

## 2017-06-14 MED ORDER — FENTANYL CITRATE (PF) 100 MCG/2ML IJ SOLN
INTRAMUSCULAR | Status: DC | PRN
  Administered 2017-06-14 (×3): 50 ug via INTRAVENOUS

## 2017-06-14 MED ORDER — ONDANSETRON HCL 4 MG/2ML IJ SOLN
INTRAMUSCULAR | Status: AC
  Filled 2017-06-14: qty 2

## 2017-06-14 MED ORDER — FENTANYL CITRATE (PF) 100 MCG/2ML IJ SOLN
INTRAMUSCULAR | Status: AC
  Filled 2017-06-14: qty 2

## 2017-06-14 MED ORDER — PROPOFOL 10 MG/ML IV BOLUS
INTRAVENOUS | Status: DC | PRN
Start: 2017-06-14 — End: 2017-06-14
  Administered 2017-06-14: 150 mg via INTRAVENOUS

## 2017-06-14 MED ORDER — DEXAMETHASONE SODIUM PHOSPHATE 10 MG/ML IJ SOLN
INTRAMUSCULAR | Status: AC
  Filled 2017-06-14: qty 1

## 2017-06-14 MED ORDER — BUPIVACAINE-EPINEPHRINE (PF) 0.5% -1:200000 IJ SOLN
INTRAMUSCULAR | Status: AC
  Filled 2017-06-14: qty 30

## 2017-06-14 MED ORDER — BUPIVACAINE-EPINEPHRINE 0.5% -1:200000 IJ SOLN
INTRAMUSCULAR | Status: DC | PRN
  Administered 2017-06-14: 25 mL

## 2017-06-14 MED ORDER — LIDOCAINE HCL (CARDIAC) 20 MG/ML IV SOLN
INTRAVENOUS | Status: DC | PRN
  Administered 2017-06-14: 50 mg via INTRAVENOUS

## 2017-06-14 MED ORDER — CEFAZOLIN SODIUM-DEXTROSE 2-4 GM/100ML-% IV SOLN
INTRAVENOUS | Status: AC
  Filled 2017-06-14: qty 100

## 2017-06-14 MED ORDER — LIDOCAINE HCL (PF) 2 % IJ SOLN
INTRAMUSCULAR | Status: AC
  Filled 2017-06-14: qty 2

## 2017-06-14 MED ORDER — ONDANSETRON HCL 4 MG/2ML IJ SOLN
INTRAMUSCULAR | Status: DC | PRN
  Administered 2017-06-14: 4 mg via INTRAVENOUS

## 2017-06-14 MED ORDER — ACETAMINOPHEN 500 MG PO TABS
1000.0000 mg | ORAL_TABLET | ORAL | Status: AC
  Administered 2017-06-14: 1000 mg via ORAL

## 2017-06-14 MED ORDER — LABETALOL HCL 5 MG/ML IV SOLN
5.0000 mg | Freq: Once | INTRAVENOUS | Status: AC
  Administered 2017-06-14: 5 mg via INTRAVENOUS

## 2017-06-14 MED ORDER — FENTANYL CITRATE (PF) 100 MCG/2ML IJ SOLN: 25.0000 ug | INTRAMUSCULAR | Status: DC | PRN

## 2017-06-14 MED ORDER — GABAPENTIN 300 MG PO CAPS
ORAL_CAPSULE | ORAL | Status: AC
  Administered 2017-06-14: 300 mg via ORAL
  Filled 2017-06-14: qty 1

## 2017-06-14 MED ORDER — LACTATED RINGERS IV SOLN
INTRAVENOUS | Status: DC
  Administered 2017-06-14: 10:00:00 via INTRAVENOUS

## 2017-06-14 MED ORDER — FAMOTIDINE 20 MG PO TABS
ORAL_TABLET | ORAL | Status: AC
  Administered 2017-06-14: 20 mg via ORAL
  Filled 2017-06-14: qty 1

## 2017-06-14 MED ORDER — FAMOTIDINE 20 MG PO TABS
20.0000 mg | ORAL_TABLET | Freq: Once | ORAL | Status: AC
  Administered 2017-06-14: 20 mg via ORAL

## 2017-06-14 MED ORDER — LABETALOL HCL 5 MG/ML IV SOLN
INTRAVENOUS | Status: AC
  Administered 2017-06-14: 5 mg via INTRAVENOUS
  Filled 2017-06-14: qty 4

## 2017-06-14 MED ORDER — KETOROLAC TROMETHAMINE 30 MG/ML IJ SOLN
INTRAMUSCULAR | Status: DC | PRN
  Administered 2017-06-14: 15 mg via INTRAVENOUS

## 2017-06-14 MED ORDER — GABAPENTIN 300 MG PO CAPS
300.0000 mg | ORAL_CAPSULE | ORAL | Status: AC
  Administered 2017-06-14: 300 mg via ORAL

## 2017-06-14 MED ORDER — PHENYLEPHRINE HCL 10 MG/ML IJ SOLN
INTRAMUSCULAR | Status: DC | PRN
  Administered 2017-06-14 (×5): 100 ug via INTRAVENOUS

## 2017-06-14 MED ORDER — ACETAMINOPHEN 500 MG PO TABS
ORAL_TABLET | ORAL | Status: AC
  Administered 2017-06-14: 1000 mg via ORAL
  Filled 2017-06-14: qty 2

## 2017-06-14 MED ORDER — HYDROCODONE-ACETAMINOPHEN 5-325 MG PO TABS: 1.0000 | ORAL_TABLET | Freq: Four times a day (QID) | ORAL | 0 refills | Status: DC | PRN

## 2017-06-14 SURGICAL SUPPLY — 37 items
BLADE SURG 15 STRL LF DISP TIS (BLADE) ×1 IMPLANT
BLADE SURG 15 STRL SS (BLADE) ×2
BRA SURGICAL MEDIUM (MISCELLANEOUS) ×3 IMPLANT
CANISTER SUCT 1200ML W/VALVE (MISCELLANEOUS) ×3 IMPLANT
CHLORAPREP W/TINT 26ML (MISCELLANEOUS) ×3 IMPLANT
CLIP TI WIDE RED SMALL 6 (CLIP) ×3 IMPLANT
CNTNR SPEC 2.5X3XGRAD LEK (MISCELLANEOUS)
CONT SPEC 4OZ STER OR WHT (MISCELLANEOUS)
CONTAINER SPEC 2.5X3XGRAD LEK (MISCELLANEOUS) IMPLANT
COVER PROBE FLX POLY STRL (MISCELLANEOUS) IMPLANT
DERMABOND ADVANCED (GAUZE/BANDAGES/DRESSINGS) ×2
DERMABOND ADVANCED .7 DNX12 (GAUZE/BANDAGES/DRESSINGS) ×1 IMPLANT
DEVICE DUBIN SPECIMEN MAMMOGRA (MISCELLANEOUS) ×3 IMPLANT
DRAPE LAPAROTOMY 100X77 ABD (DRAPES) ×3 IMPLANT
ELECT REM PT RETURN 9FT ADLT (ELECTROSURGICAL) ×3
ELECTRODE REM PT RTRN 9FT ADLT (ELECTROSURGICAL) ×1 IMPLANT
GAUZE FLUFF 18X24 1PLY STRL (GAUZE/BANDAGES/DRESSINGS) ×3 IMPLANT
GLOVE SURG SYN 7.0 (GLOVE) ×6 IMPLANT
GLOVE SURG SYN 7.5  E (GLOVE) ×4
GLOVE SURG SYN 7.5 E (GLOVE) ×2 IMPLANT
GOWN STRL REUS W/ TWL LRG LVL3 (GOWN DISPOSABLE) ×2 IMPLANT
GOWN STRL REUS W/TWL LRG LVL3 (GOWN DISPOSABLE) ×4
KIT RM TURNOVER STRD PROC AR (KITS) ×3 IMPLANT
LABEL OR SOLS (LABEL) ×3 IMPLANT
NDL SAFETY 22GX1.5 (NEEDLE) ×3 IMPLANT
NEEDLE HYPO 25X1 1.5 SAFETY (NEEDLE) ×3 IMPLANT
PACK BASIN MINOR ARMC (MISCELLANEOUS) ×3 IMPLANT
SUT ETHILON 3-0 FS-10 30 BLK (SUTURE)
SUT MNCRL 4-0 (SUTURE) ×2
SUT MNCRL 4-0 27XMFL (SUTURE) ×1
SUT SILK 3 0 SH 30 (SUTURE) IMPLANT
SUT VIC AB 3-0 SH 27 (SUTURE) ×2
SUT VIC AB 3-0 SH 27X BRD (SUTURE) ×1 IMPLANT
SUTURE EHLN 3-0 FS-10 30 BLK (SUTURE) IMPLANT
SUTURE MNCRL 4-0 27XMF (SUTURE) ×1 IMPLANT
SYR BULB IRRIG 60ML STRL (SYRINGE) ×3 IMPLANT
WATER STERILE IRR 1000ML POUR (IV SOLUTION) ×3 IMPLANT

## 2017-06-14 NOTE — OR Nursing (Signed)
Ronnald Collum, interpreter, here to assist with discharge instructions.  Questions answered and patient and family appear to have clear understanding of what to do once home, what to expect and how to handle all situations.

## 2017-06-14 NOTE — H&P (View-Only) (Signed)
06/01/2017  Reason for Visit:  Right breast DCIS  History of Present Illness: Rebekah Kelly is a 66 y.o. female who presents with new diagnosis of right breast DCIS. She had a screening mammography on 7/12 which required additional imaging on 7/19. 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 ER and PR receptors were negative. She presents today for further evaluation.  She denies feeling any lumps or masses during her self exams. He denies having any skin changes or discoloration. Denies any pain prior to the biopsy. Denies any nipple changes or discharge. There is no family history of any cancer.  Past Medical History: Past Medical History:  Diagnosis Date  . GERD without esophagitis 05/21/2015  . HTN (hypertension) 03/11/2014  . Hypercholesterolemia 09/20/2016  . Hyperlipidemia   . Hypertension      Past Surgical History: Past Surgical History:  Procedure Laterality Date  . BREAST BIOPSY Right 05/06/2017   stereo/ path pending  . COLONOSCOPY WITH PROPOFOL N/A 02/28/2017   Procedure: COLONOSCOPY WITH PROPOFOL;  Surgeon: Manya Silvas, MD;  Location: [facility] ENDOSCOPY;  Service: Endoscopy;  Laterality: N/A;  . ESOPHAGOGASTRODUODENOSCOPY (EGD) WITH PROPOFOL N/A 02/28/2017   Procedure: ESOPHAGOGASTRODUODENOSCOPY (EGD) WITH PROPOFOL;  Surgeon: Manya Silvas, MD;  Location: [facility] ENDOSCOPY;  Service: Endoscopy;  Laterality: N/A;  . TUBAL LIGATION      Home Medications: Prior to Admission medications   Medication Sig Start Date End Date Taking? Authorizing Provider  aspirin EC 81 MG tablet Take 81 mg by mouth daily.    [provider]  lisinopril-hydrochlorothiazide (PRINZIDE,ZESTORETIC) 10-12.5 MG tablet Take 1 tablet by mouth daily.    [provider]  omeprazole (PRILOSEC) 20 MG capsule Take by mouth.  12/09/16 12/09/17  [provider]  ondansetron (ZOFRAN ODT) 4 MG disintegrating tablet Take 1 tablet (4 mg total) by mouth every 8 (eight) hours as needed for nausea or vomiting. Patient not taking: Reported on 02/28/2017 09/04/16   Harvest Dark, MD  polyethylene glycol powder [facility]) powder As directed for colonic prep. 02/25/17   [provider]    Allergies: No Known Allergies  Social History:  reports that she has never smoked. She has never used smokeless tobacco. She reports that she does not drink alcohol or use drugs.   Family History: Family History  Problem Relation Age of Onset  . Breast cancer Neg Hx     Review of Systems: Review of Systems  Constitutional: Negative for chills and fever.  HENT: Negative for hearing loss.   Eyes: Negative for blurred vision.  Respiratory: Negative for cough.   Cardiovascular: Negative for chest pain.  Gastrointestinal: Negative for abdominal pain, nausea and vomiting.  Genitourinary: Negative for dysuria.  Musculoskeletal: Negative for myalgias.  Skin: Negative for rash.  Neurological: Negative for dizziness.  Psychiatric/Behavioral: Negative for depression.  All other systems reviewed and are negative.   Physical Exam BP (!) 193/110   Pulse 90   Temp 98.3 F (36.8 C) (Oral)   Ht 5\' 3"  (1.6 m)   Wt 74.8 kg (165 lb)   BMI 29.23 kg/m  CONSTITUTIONAL: No acute distress HEENT:  Normocephalic, atraumatic, extraocular motion intact. NECK: Trachea is midline, and there is no jugular venous distension.  RESPIRATORY:  Lungs are clear, and breath sounds are equal bilaterally. Normal respiratory effort without pathologic use of accessory muscles. CARDIOVASCULAR: Heart  is regular without murmurs, gallops, or rubs. BREAST: Examination of right breast reveals no palpable masses. Patient status post right breast biopsy with mild ecchymosis surrounding the biopsy site. This is dressed with Steri-Strips. Minimal  tenderness at the biopsy site itself. No axillary lymphadenopathy. Left breast and left axilla negative. GI: The abdomen is soft, nondistended, nontender.  MUSCULOSKELETAL:  Normal muscle strength and tone in all four extremities.  No peripheral edema or cyanosis. SKIN: Skin turgor is normal. There are no pathologic skin lesions.  NEUROLOGIC:  Motor and sensation is grossly normal.  Cranial nerves are grossly intact. PSYCH:  Alert and oriented to person, place and time. Affect is normal.  Laboratory Analysis: Biopsy results from 7/27 showed ductal carcinoma in situ, nuclear grade 2 with microcalcifications.  Imaging: Mammogram on 7/12 and 7/19 showed suspicious right breast calcifications over the outer upper quadrant.  Assessment and Plan: This is a 66 y.o. female who presents with new diagnosis of right breast DCIS.  Given the findings, discussed with the patient the need for surgical excision. Currently given that these are microcalcifications, would require a needle localized excisional biopsy. Currently given that pathology shows DCIS only, would not require any sentinel node biopsy and only an excisional biopsy of the mass. Discussed with the patient currently we know that she has DCIS, however, there is a percentage of patients that despite of this pathology finding, may also have invasive cancer on the final excision. Discussed with the patient risks and benefits of the procedure including risk of bleeding, infection, injury to surrounding structures, need for further procedures. The patient is willing to proceed. Pending on the results from pathology after the excision, she would require referral to radiation oncology +/- medical oncology.  Face-to-face time spent with the patient and care providers was 45 minutes, with more than 50% of the time spent counseling, educating, and coordinating care of the patient.     Melvyn Neth, [place]

## 2017-06-14 NOTE — Anesthesia Post-op Follow-up Note (Signed)
Anesthesia QCDR form completed.        

## 2017-06-14 NOTE — Anesthesia Procedure Notes (Signed)
Procedure Name: LMA Insertion Performed by: Oakleigh Hesketh Pre-anesthesia Checklist: Patient identified, Patient being monitored, Timeout performed, Emergency Drugs available and Suction available Patient Re-evaluated:Patient Re-evaluated prior to induction Oxygen Delivery Method: Circle system utilized Preoxygenation: Pre-oxygenation with 100% oxygen Induction Type: IV induction LMA: LMA inserted LMA Size: 3.5 Tube type: Oral Number of attempts: 1 Placement Confirmation: positive ETCO2 and breath sounds checked- equal and bilateral Tube secured with: Tape Dental Injury: Teeth and Oropharynx as per pre-operative assessment        

## 2017-06-14 NOTE — Interval H&P Note (Signed)
History and Physical Interval Note:  06/14/2017 10:00 AM  [place]  has presented today for surgery, with the diagnosis of right breast DCIS  The various methods of treatment have been discussed with the patient and family. After consideration of risks, benefits and other options for treatment, the patient has consented to  Procedure(s): BREAST LUMPECTOMY WITH NEEDLE LOCALIZATION (Right) as a surgical intervention .  The patient's history has been reviewed, patient examined, no change in status, stable for surgery.  I have reviewed the patient's chart and labs.  Questions were answered to the patient's satisfaction.     Lowella Kindley

## 2017-06-14 NOTE — Discharge Instructions (Signed)
AMBULATORY SURGERY  DISCHARGE INSTRUCTIONS   1) The drugs that you were given will stay in your system until tomorrow so for the next 24 hours you should not:  A) Drive an automobile B) Make any legal decisions C) Drink any alcoholic beverage   2) You may resume regular meals tomorrow.  Today it is better to start with liquids and gradually work up to solid foods.  You may eat anything you prefer, but it is better to start with liquids, then soup and crackers, and gradually work up to solid foods.   3) Please notify your doctor immediately if you have any unusual bleeding, trouble breathing, redness and pain at the surgery site, drainage, fever, or pain not relieved by medication.    4) Additional Instructions:Do not use your right arm for repetitive movements, such as vacuuming or sweeping. Do not carry a pocketbook on your right side.  No lifting heavy groceries, laundry or anything over 10 pounds.  Make sure to use both arms if lifting something out of refrigerator, such as milk. Take pain medicine as needed.  You may take Tylenol or ibuprofen/motrin in between doses of norco to enhance the effects.  If you feel you do not need a narcotic for pain relief, then just take Tylenol or Motrin.  If you do take Norco, please take stool softeners along with this medicine.    Please contact your physician with any problems or Same Day Surgery at [phone], Monday through Friday 6 am to 4 pm, or [place] at [facility] number at [phone].

## 2017-06-14 NOTE — Transfer of Care (Signed)
Immediate Anesthesia Transfer of Care Note  Patient: Rebekah Kelly  Procedure(s) Performed: Procedure(s): BREAST LUMPECTOMY WITH NEEDLE LOCALIZATION (Right)  Patient Location: PACU  Anesthesia Type:General  Level of Consciousness: awake, alert  and responds to stimulation  Airway & Oxygen Therapy: Patient Spontanous Breathing and Patient connected to face mask oxygen  Post-op Assessment: Report given to RN and Post -op Vital signs reviewed and stable  Post vital signs: Reviewed and stable  Last Vitals:  Vitals:   06/14/17 1133 06/14/17 1138  BP: (!) 165/80 (!) 165/80  Pulse: (!) 108 (!) 107  Resp: 14 13  Temp: (!) 36.4 C   SpO2: 100% 100%    Last Pain:  Vitals:   06/14/17 0932  TempSrc: Oral  PainSc: 1          Complications: No apparent anesthesia complications

## 2017-06-14 NOTE — Anesthesia Preprocedure Evaluation (Addendum)
Anesthesia Evaluation  Patient identified by MRN, date of birth, ID band Patient awake    Reviewed: Allergy & Precautions, NPO status , Patient's Chart, lab work & pertinent test results, reviewed documented beta blocker date and time   Airway Mallampati: III  TM Distance: >3 FB     Dental  (+) Chipped   Pulmonary           Cardiovascular hypertension, Pt. on medications      Neuro/Psych Anxiety    GI/Hepatic GERD  Controlled,  Endo/Other    Renal/GU      Musculoskeletal   Abdominal   Peds  Hematology   Anesthesia Other Findings Obese. Has been very hypertensive this am. EKG normal.  Reproductive/Obstetrics                            Anesthesia Physical Anesthesia Plan  ASA: III  Anesthesia Plan: General   Post-op Pain Management:    Induction: Intravenous  PONV Risk Score and Plan:   Airway Management Planned: LMA  Additional Equipment:   Intra-op Plan:   Post-operative Plan:   Informed Consent: I have reviewed the patients History and Physical, chart, labs and discussed the procedure including the risks, benefits and alternatives for the proposed anesthesia with the patient or authorized representative who has indicated his/her understanding and acceptance.     Plan Discussed with: CRNA  Anesthesia Plan Comments:         Anesthesia Quick Evaluation

## 2017-06-14 NOTE — Anesthesia Postprocedure Evaluation (Signed)
Anesthesia Post Note  Patient: Rebekah Kelly  Procedure(s) Performed: Procedure(s) (LRB): BREAST LUMPECTOMY WITH NEEDLE LOCALIZATION (Right)  Patient location during evaluation: PACU Anesthesia Type: General Level of consciousness: awake and alert Pain management: pain level controlled Vital Signs Assessment: post-procedure vital signs reviewed and stable Respiratory status: spontaneous breathing, nonlabored ventilation, respiratory function stable and patient connected to nasal cannula oxygen Cardiovascular status: blood pressure returned to baseline and stable Postop Assessment: no signs of nausea or vomiting Anesthetic complications: no     Last Vitals:  Vitals:   06/14/17 1244 06/14/17 1325  BP: (!) 185/89 (!) 183/85  Pulse: 88 88  Resp: 16 14  Temp: 36.9 C   SpO2: 99% 98%    Last Pain:  Vitals:   06/14/17 1325  TempSrc:   PainSc: 0-No pain                 Michaeljoseph Revolorio S

## 2017-06-14 NOTE — Op Note (Signed)
  Procedure Date:  06/14/2017  Pre-operative Diagnosis:  Right breast DCIS  Post-operative Diagnosis:  Right breast DCIS  Procedure:  Right breast wire-localized lumpectomy  Surgeon:  Melvyn Neth, MD  Anesthesia:  General endotracheal  Estimated Blood Loss:  3 ml  Specimens:  Right breast tissue, with intact wire and biopsy clip  Complications:  None  Indications for Procedure:  This is a 66 y.o. female who presents with right breast DCIS.  The risks of bleeding, infection, injury to surrounding structures, hematoma, seroma, open wound, cosmetic deformity, and the need for further surgery were all discussed with the patient and was willing to proceed.  Prior to this procedure, the patient had undergone wire localization and sentinel lymphoscintigraphy.  Description of Procedure: The patient was correctly identified in the preoperative area and brought into the operating room.  The patient was placed supine with VTE prophylaxis in place.  Appropriate time-outs were performed.  Anesthesia was induced and the patient was intubated.  Appropriate antibiotics were infused.  Attention was turned to the needle localization site where an incision was made encompassing the needle insertion site. Elecrocautery was used for dissection around the needle to perform a lumpectomy around the wire.  2-0 silk suture was used to mark the specimen short superior and long lateral and single suture deep.  The specimen including the wire was sent to radiology which confirmed an intact wire and prior biopsy site clip.  The cavity was irrigated and hemostasis was assured with electrocautery.  The deep, superior, lateral, medial, and inferior margins of the breast excision site were marked with small clips.  Local anesthetic was infiltrated into the skin and subcutaneous tissue of the cavity.  The wound was then closed in two layers with 3-0 Vicryl and 4-0 Monocryl and sealed with DermaBond.  The patient was  emerged from anesthesia and extubated and brought to the recovery room for further management.  The patient tolerated the procedure well and all counts were correct at the end of the case.   Melvyn Neth, MD

## 2017-06-15 ENCOUNTER — Encounter: Payer: Self-pay | Admitting: Surgery

## 2017-06-15 LAB — SURGICAL PATHOLOGY

## 2017-06-16 ENCOUNTER — Telehealth: Payer: Self-pay

## 2017-06-16 NOTE — Telephone Encounter (Signed)
Post-op call made to patient at this time. Spoke with patient. Post-op interview questions below.  1. How are you feeling? Feeling well.   2. Is your pain controlled? Yes  3. What are you doing for the pain? Taking the pain medication.  4. Are you having any Nausea or Vomiting? no  5. Are you having any Fever or Chills? No  6. Are you having any Constipation or Diarrhea? No  7. Is there any Swelling or Bruising you are concerned about? No  8. Do you have any questions or concerns at this time? Just wanted to know if she was able to take a shower. I told her that she could tomorrow. Patient had no further questions.

## 2017-07-04 ENCOUNTER — Ambulatory Visit (INDEPENDENT_AMBULATORY_CARE_PROVIDER_SITE_OTHER): Payer: BLUE CROSS/BLUE SHIELD | Admitting: Surgery

## 2017-07-04 ENCOUNTER — Encounter: Payer: Self-pay | Admitting: Surgery

## 2017-07-04 VITALS — BP 194/103 | HR 97 | Temp 97.7°F | Ht 63.0 in | Wt 166.2 lb

## 2017-07-04 DIAGNOSIS — D0511 Intraductal carcinoma in situ of right breast: Secondary | ICD-10-CM

## 2017-07-04 DIAGNOSIS — Z09 Encounter for follow-up examination after completed treatment for conditions other than malignant neoplasm: Secondary | ICD-10-CM

## 2017-07-04 NOTE — Progress Notes (Signed)
07/04/2017  HPI: Patient is s/p right breast wire-localized lumpectomy for DCIS.  Pathology resulted in DCIS with negative margins, though close at the superior and inferior.  She presents for follow up.  Reports that she's been healing well, without significant pain.  She does report some intermittent right axillary discomfort, which is short lived, and that sometimes she feels some swelling of the right breast.  Otherwise denies any fevers, chills, or wound issues.  Continues to wear the surgical bra.  Vital signs: BP (!) 194/103   Pulse 97   Temp 97.7 F (36.5 C) (Oral)   Ht 5\' 3"  (1.6 m)   Wt 75.4 kg (166 lb 3.2 oz)   BMI 29.44 kg/m    Physical Exam: Constitutional: No acute distress Breast:  Right breast s/p lumpectomy.  Incision is clean, dry, intact.  There is no erythema or drainage.  There is currently no edema or swelling and there is no tenderness to palpation over the right axilla or right chest.  Assessment/Plan: 65 yo female s/p right breast wire-localized lumpectomy for DCIS.  --Reassured the patient that likely the symptoms are post-op in nature and will continue to improve on their own.  Recommended that she continue wearing her surgical bra to continue applying pressure on the right breast to help with any seroma formation.   --Discussed with patient and her husband the pathology findings. --She has an appointment on 9/27 with Dr. Baruch Gouty with radiation oncology to discuss and plan radiation to the right breast.  Her husband asked about postponing the appointment to next week.  This has been rescheduled to 10/1. --Patient will then follow up with [place] 6 months after radiation is complete.   Melvyn Neth, [place]

## 2017-07-04 NOTE — Patient Instructions (Signed)
La veremos en 6 meses despues de su Radiacion.   Le vamos hacer una cita para Christian Mate y [place].

## 2017-07-07 ENCOUNTER — Ambulatory Visit: Payer: BLUE CROSS/BLUE SHIELD | Admitting: Radiation Oncology

## 2017-07-11 ENCOUNTER — Encounter: Payer: Self-pay | Admitting: Radiation Oncology

## 2017-07-11 ENCOUNTER — Ambulatory Visit
Admission: RE | Admit: 2017-07-11 | Discharge: 2017-07-11 | Disposition: A | Discharge status: Home / Self Care | Payer: BLUE CROSS/BLUE SHIELD | Source: Ambulatory Visit | Attending: Radiation Oncology | Admitting: Radiation Oncology

## 2017-07-11 VITALS — BP 202/101 | HR 94 | Temp 97.8°F | Resp 18 | Wt 165.3 lb

## 2017-07-11 DIAGNOSIS — Z510 Encounter for antineoplastic radiation therapy: Secondary | ICD-10-CM | POA: Insufficient documentation

## 2017-07-11 DIAGNOSIS — I10 Essential (primary) hypertension: Secondary | ICD-10-CM | POA: Insufficient documentation

## 2017-07-11 DIAGNOSIS — E7800 Pure hypercholesterolemia, unspecified: Secondary | ICD-10-CM | POA: Insufficient documentation

## 2017-07-11 DIAGNOSIS — Z79899 Other long term (current) drug therapy: Secondary | ICD-10-CM | POA: Insufficient documentation

## 2017-07-11 DIAGNOSIS — D0511 Intraductal carcinoma in situ of right breast: Secondary | ICD-10-CM

## 2017-07-11 DIAGNOSIS — Z7982 Long term (current) use of aspirin: Secondary | ICD-10-CM | POA: Diagnosis not present

## 2017-07-11 DIAGNOSIS — K219 Gastro-esophageal reflux disease without esophagitis: Secondary | ICD-10-CM | POA: Insufficient documentation

## 2017-07-11 DIAGNOSIS — Z171 Estrogen receptor negative status [ER-]: Secondary | ICD-10-CM | POA: Insufficient documentation

## 2017-07-11 DIAGNOSIS — F419 Anxiety disorder, unspecified: Secondary | ICD-10-CM | POA: Insufficient documentation

## 2017-07-11 NOTE — Consult Note (Signed)
NEW PATIENT EVALUATION  Name: Rebekah Kelly  MRN: [id number]  Date:   07/11/2017     DOB: 1951/02/05   This 66 y.o. 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  REFERRING PHYSICIAN: Tracie Harrier, MD  CHIEF COMPLAINT:  Chief Complaint  Patient presents with  . Breast Cancer    Initial Evaluation    DIAGNOSIS: The encounter diagnosis was Ductal carcinoma in situ (DCIS) of right breast.   PREVIOUS INVESTIGATIONS:  Pathology reports reviewed Mammograms and ultrasound reviewed Clinical notes reviewed  HPI: patient is a 66 year old Spanish-speaking female seen accompanied by interpreter and her husband. She presented with an abnormal mammogramof the right breastshowing pleomorphic linear oriented calcifications proximal 1.4 cm in the upperight quadrant. This prompt ultrasound-guided biopsy which was positive for ER/PR negative ductal carcinoma in situ. She went on to have a wide local excision for 9.5 mm area of ductal carcinoma in situ with margins clear but close at 0.5 mm. Tumor was nuclear grade 2 with no comedo necrosis noted. She's been having some tenderness in the resection site although appears to be healing extremely well. She is now referred to radiation oncology for consideration of treatment.I discussed with the surgeon the close margin and emphasized that I can boost that with high-dose electrons which is part of our treatment plan.  PLANNED TREATMENT REGIMEN: right whole breast radiation  PAST MEDICAL HISTORY:  has a past medical history of Anxiety; GERD without esophagitis (05/21/2015); HTN (hypertension) (03/11/2014); Hypercholesterolemia (09/20/2016); Hyperlipidemia; and Hypertension.    PAST SURGICAL HISTORY:  Past Surgical History:  Procedure Laterality Date  . BREAST BIOPSY Right 05/06/2017   stereo + DCIS  .  BREAST EXCISIONAL BIOPSY Right 06/14/2017   lumpectomy  . BREAST LUMPECTOMY WITH NEEDLE LOCALIZATION Right 06/14/2017   Procedure: BREAST LUMPECTOMY WITH NEEDLE LOCALIZATION;  Surgeon: Olean Ree, MD;  Location: [facility] ORS;  Service: General;  Laterality: Right;  . COLONOSCOPY WITH PROPOFOL N/A 02/28/2017   Procedure: COLONOSCOPY WITH PROPOFOL;  Surgeon: Manya Silvas, MD;  Location: [facility] ENDOSCOPY;  Service: Endoscopy;  Laterality: N/A;  . ESOPHAGOGASTRODUODENOSCOPY (EGD) WITH PROPOFOL N/A 02/28/2017   Procedure: ESOPHAGOGASTRODUODENOSCOPY (EGD) WITH PROPOFOL;  Surgeon: Manya Silvas, MD;  Location: [facility] ENDOSCOPY;  Service: Endoscopy;  Laterality: N/A;  . TUBAL LIGATION      FAMILY HISTORY: family history includes Healthy in her mother; Heart attack in her father.  SOCIAL HISTORY:  reports that she has never smoked. She has never used smokeless tobacco. She reports that she does not drink alcohol or use drugs.  ALLERGIES: Patient has no known allergies.  MEDICATIONS:  Current Outpatient Prescriptions  Medication Sig Dispense Refill  . aspirin EC 81 MG tablet Take 81 mg by mouth daily.    Marland Kitchen lisinopril-hydrochlorothiazide (PRINZIDE,ZESTORETIC) 10-12.5 MG tablet Take 1 tablet by mouth daily.    . Multiple Vitamin (MULTIVITAMIN WITH MINERALS) TABS tablet Take 1 tablet by mouth daily.     No current facility-administered medications for this encounter.     ECOG PERFORMANCE STATUS:  0 - Asymptomatic  REVIEW OF SYSTEMS:  Patient denies any weight loss, fatigue, weakness, fever, chills or night sweats. Patient denies any loss of vision, blurred vision. Patient denies any ringing  of the ears or hearing loss. No irregular heartbeat. Patient denies heart murmur or history of fainting. Patient denies any chest pain or pain  radiating to her upper extremities. Patient denies any shortness of breath, difficulty breathing at night, cough or hemoptysis. Patient denies any swelling in the lower  legs. Patient denies any nausea vomiting, vomiting of blood, or coffee ground material in the vomitus. Patient denies any stomach pain. Patient states has had normal bowel movements no significant constipation or diarrhea. Patient denies any dysuria, hematuria or significant nocturia. Patient denies any problems walking, swelling in the joints or loss of balance. Patient denies any skin changes, loss of hair or loss of weight. Patient denies any excessive worrying or anxiety or significant depression. Patient denies any problems with insomnia. Patient denies excessive thirst, polyuria, polydipsia. Patient denies any swollen glands, patient denies easy bruising or easy bleeding. Patient denies any recent infections, allergies or URI. Patient "s visual fields have not changed significantly in recent time.    PHYSICAL EXAM: BP (!) 202/101   Pulse 94   Temp 97.8 F (36.6 C)   Resp 18   Wt 165 lb 5.5 oz (75 kg)   BMI 29.29 kg/m  Right breast is wide local excision scar which is healing well. No dominant mass or nodularity is noted in either breast in 2 positions examined. No axillary or supraclavicular adenopathy is noted. Well-developed well-nourished patient in NAD. HEENT reveals PERLA, EOMI, discs not visualized.  Oral cavity is clear. No oral mucosal lesions are identified. Neck is clear without evidence of cervical or supraclavicular adenopathy. Lungs are clear to A&P. Cardiac examination is essentially unremarkable with regular rate and rhythm without murmur rub or thrill. Abdomen is benign with no organomegaly or masses noted. Motor sensory and DTR levels are equal and symmetric in the upper and lower extremities. Cranial nerves II through XII are grossly intact. Proprioception is intact. No peripheral adenopathy or edema is identified. No motor or sensory levels are noted. Crude visual fields are within normal range.  LABORATORY DATA: pathology reports reviewed    RADIOLOGY RESULTS:mammograms  and ultrasound reviewed   IMPRESSION: ER/PR negative DCIS status post wide local excision of the right breast in 66 year old female  PLAN: at this time I have recommended whole breast radiation. I would treat hypofractionated course over 4 weeks boosting her scar another 1600 cGy using electron beam based on the close margin. Risks and benefits of treatment including skin reaction fatigue alteration of blood counts possible inclusion of superficial lung all were described in detail to the patient. Patient and her husband both seem to comprehend my treatment plan well. I've [place] set up and scheduled CT simulation for later this week. Entire conversation was done through an interpreter who will be present for simulation and consent signing. Patient will not be a candidate for tamoxifen after completion of treatment based on the negative ER/PR nature of her lesion.  I would like to take this opportunity to thank you for allowing me to participate in the care of your patient.Armstead Peaks., MD

## 2017-07-13 ENCOUNTER — Ambulatory Visit
Admission: RE | Admit: 2017-07-13 | Discharge: 2017-07-13 | Disposition: A | Discharge status: Home / Self Care | Payer: BLUE CROSS/BLUE SHIELD | Source: Ambulatory Visit | Attending: Radiation Oncology | Admitting: Radiation Oncology

## 2017-07-13 DIAGNOSIS — Z510 Encounter for antineoplastic radiation therapy: Secondary | ICD-10-CM | POA: Diagnosis not present

## 2017-07-18 ENCOUNTER — Other Ambulatory Visit: Payer: Self-pay | Admitting: *Deleted

## 2017-07-18 DIAGNOSIS — Z510 Encounter for antineoplastic radiation therapy: Secondary | ICD-10-CM | POA: Diagnosis not present

## 2017-07-18 DIAGNOSIS — D0511 Intraductal carcinoma in situ of right breast: Secondary | ICD-10-CM

## 2017-07-21 ENCOUNTER — Ambulatory Visit
Admission: RE | Admit: 2017-07-21 | Discharge: 2017-07-21 | Disposition: A | Discharge status: Home / Self Care | Payer: BLUE CROSS/BLUE SHIELD | Source: Ambulatory Visit | Attending: Radiation Oncology | Admitting: Radiation Oncology

## 2017-07-21 DIAGNOSIS — Z510 Encounter for antineoplastic radiation therapy: Secondary | ICD-10-CM | POA: Diagnosis not present

## 2017-07-25 ENCOUNTER — Ambulatory Visit
Admission: RE | Admit: 2017-07-25 | Discharge: 2017-07-25 | Disposition: A | Discharge status: Home / Self Care | Payer: BLUE CROSS/BLUE SHIELD | Source: Ambulatory Visit | Attending: Radiation Oncology | Admitting: Radiation Oncology

## 2017-07-25 DIAGNOSIS — Z510 Encounter for antineoplastic radiation therapy: Secondary | ICD-10-CM | POA: Diagnosis not present

## 2017-07-26 ENCOUNTER — Ambulatory Visit
Admission: RE | Admit: 2017-07-26 | Discharge: 2017-07-26 | Disposition: A | Discharge status: Home / Self Care | Payer: BLUE CROSS/BLUE SHIELD | Source: Ambulatory Visit | Attending: Radiation Oncology | Admitting: Radiation Oncology

## 2017-07-26 DIAGNOSIS — Z510 Encounter for antineoplastic radiation therapy: Secondary | ICD-10-CM | POA: Diagnosis not present

## 2017-07-27 ENCOUNTER — Ambulatory Visit
Admission: RE | Admit: 2017-07-27 | Discharge: 2017-07-27 | Disposition: A | Discharge status: Home / Self Care | Payer: BLUE CROSS/BLUE SHIELD | Source: Ambulatory Visit | Attending: Radiation Oncology | Admitting: Radiation Oncology

## 2017-07-27 DIAGNOSIS — Z510 Encounter for antineoplastic radiation therapy: Secondary | ICD-10-CM | POA: Diagnosis not present

## 2017-07-28 ENCOUNTER — Ambulatory Visit
Admission: RE | Admit: 2017-07-28 | Discharge: 2017-07-28 | Disposition: A | Discharge status: Home / Self Care | Payer: BLUE CROSS/BLUE SHIELD | Source: Ambulatory Visit | Attending: Radiation Oncology | Admitting: Radiation Oncology

## 2017-07-28 DIAGNOSIS — Z510 Encounter for antineoplastic radiation therapy: Secondary | ICD-10-CM | POA: Diagnosis not present

## 2017-07-29 ENCOUNTER — Ambulatory Visit
Admission: RE | Admit: 2017-07-29 | Discharge: 2017-07-29 | Disposition: A | Discharge status: Home / Self Care | Payer: BLUE CROSS/BLUE SHIELD | Source: Ambulatory Visit | Attending: Radiation Oncology | Admitting: Radiation Oncology

## 2017-07-29 DIAGNOSIS — Z510 Encounter for antineoplastic radiation therapy: Secondary | ICD-10-CM | POA: Diagnosis not present

## 2017-08-01 ENCOUNTER — Ambulatory Visit
Admission: RE | Admit: 2017-08-01 | Discharge: 2017-08-01 | Disposition: A | Discharge status: Home / Self Care | Payer: BLUE CROSS/BLUE SHIELD | Source: Ambulatory Visit | Attending: Radiation Oncology | Admitting: Radiation Oncology

## 2017-08-01 DIAGNOSIS — Z510 Encounter for antineoplastic radiation therapy: Secondary | ICD-10-CM | POA: Diagnosis not present

## 2017-08-02 ENCOUNTER — Ambulatory Visit: Payer: BLUE CROSS/BLUE SHIELD

## 2017-08-03 ENCOUNTER — Ambulatory Visit
Admission: RE | Admit: 2017-08-03 | Discharge: 2017-08-03 | Disposition: A | Discharge status: Home / Self Care | Payer: BLUE CROSS/BLUE SHIELD | Source: Ambulatory Visit | Attending: Radiation Oncology | Admitting: Radiation Oncology

## 2017-08-03 DIAGNOSIS — Z510 Encounter for antineoplastic radiation therapy: Secondary | ICD-10-CM | POA: Diagnosis not present

## 2017-08-04 ENCOUNTER — Inpatient Hospital Stay: Payer: BLUE CROSS/BLUE SHIELD | Attending: Radiation Oncology

## 2017-08-04 ENCOUNTER — Ambulatory Visit
Admission: RE | Admit: 2017-08-04 | Discharge: 2017-08-04 | Disposition: A | Discharge status: Home / Self Care | Payer: BLUE CROSS/BLUE SHIELD | Source: Ambulatory Visit | Attending: Radiation Oncology | Admitting: Radiation Oncology

## 2017-08-04 DIAGNOSIS — Z510 Encounter for antineoplastic radiation therapy: Secondary | ICD-10-CM | POA: Diagnosis not present

## 2017-08-04 DIAGNOSIS — D0511 Intraductal carcinoma in situ of right breast: Secondary | ICD-10-CM | POA: Insufficient documentation

## 2017-08-04 LAB — CBC
HCT: 41.6 % (ref 35.0–47.0)
HEMOGLOBIN: 14.3 g/dL (ref 12.0–16.0)
MCH: 31.4 pg (ref 26.0–34.0)
MCHC: 34.5 g/dL (ref 32.0–36.0)
MCV: 90.9 fL (ref 80.0–100.0)
Platelets: 332 10*3/uL (ref 150–440)
RBC: 4.57 MIL/uL (ref 3.80–5.20)
RDW: 13.8 % (ref 11.5–14.5)
WBC: 5.4 10*3/uL (ref 3.6–11.0)

## 2017-08-05 ENCOUNTER — Ambulatory Visit
Admission: RE | Admit: 2017-08-05 | Discharge: 2017-08-05 | Disposition: A | Discharge status: Home / Self Care | Payer: BLUE CROSS/BLUE SHIELD | Source: Ambulatory Visit | Attending: Radiation Oncology | Admitting: Radiation Oncology

## 2017-08-05 DIAGNOSIS — Z510 Encounter for antineoplastic radiation therapy: Secondary | ICD-10-CM | POA: Diagnosis not present

## 2017-08-08 ENCOUNTER — Ambulatory Visit
Admission: RE | Admit: 2017-08-08 | Discharge: 2017-08-08 | Disposition: A | Discharge status: Home / Self Care | Payer: BLUE CROSS/BLUE SHIELD | Source: Ambulatory Visit | Attending: Radiation Oncology | Admitting: Radiation Oncology

## 2017-08-08 DIAGNOSIS — Z510 Encounter for antineoplastic radiation therapy: Secondary | ICD-10-CM | POA: Diagnosis not present

## 2017-08-09 ENCOUNTER — Ambulatory Visit
Admission: RE | Admit: 2017-08-09 | Discharge: 2017-08-09 | Disposition: A | Discharge status: Home / Self Care | Payer: BLUE CROSS/BLUE SHIELD | Source: Ambulatory Visit | Attending: Radiation Oncology | Admitting: Radiation Oncology

## 2017-08-09 DIAGNOSIS — Z510 Encounter for antineoplastic radiation therapy: Secondary | ICD-10-CM | POA: Diagnosis not present

## 2017-08-10 ENCOUNTER — Ambulatory Visit
Admission: RE | Admit: 2017-08-10 | Discharge: 2017-08-10 | Disposition: A | Discharge status: Home / Self Care | Payer: BLUE CROSS/BLUE SHIELD | Source: Ambulatory Visit | Attending: Radiation Oncology | Admitting: Radiation Oncology

## 2017-08-10 DIAGNOSIS — Z510 Encounter for antineoplastic radiation therapy: Secondary | ICD-10-CM | POA: Diagnosis not present

## 2017-08-11 ENCOUNTER — Ambulatory Visit
Admission: RE | Admit: 2017-08-11 | Discharge: 2017-08-11 | Disposition: A | Discharge status: Home / Self Care | Payer: BLUE CROSS/BLUE SHIELD | Source: Ambulatory Visit | Attending: Radiation Oncology | Admitting: Radiation Oncology

## 2017-08-11 DIAGNOSIS — Z510 Encounter for antineoplastic radiation therapy: Secondary | ICD-10-CM | POA: Diagnosis not present

## 2017-08-12 ENCOUNTER — Ambulatory Visit
Admission: RE | Admit: 2017-08-12 | Discharge: 2017-08-12 | Disposition: A | Discharge status: Home / Self Care | Payer: BLUE CROSS/BLUE SHIELD | Source: Ambulatory Visit | Attending: Radiation Oncology | Admitting: Radiation Oncology

## 2017-08-12 DIAGNOSIS — Z510 Encounter for antineoplastic radiation therapy: Secondary | ICD-10-CM | POA: Diagnosis not present

## 2017-08-15 ENCOUNTER — Ambulatory Visit
Admission: RE | Admit: 2017-08-15 | Discharge: 2017-08-15 | Disposition: A | Discharge status: Home / Self Care | Payer: BLUE CROSS/BLUE SHIELD | Source: Ambulatory Visit | Attending: Radiation Oncology | Admitting: Radiation Oncology

## 2017-08-15 DIAGNOSIS — Z510 Encounter for antineoplastic radiation therapy: Secondary | ICD-10-CM | POA: Diagnosis not present

## 2017-08-16 ENCOUNTER — Ambulatory Visit
Admission: RE | Admit: 2017-08-16 | Discharge: 2017-08-16 | Disposition: A | Discharge status: Home / Self Care | Payer: BLUE CROSS/BLUE SHIELD | Source: Ambulatory Visit | Attending: Radiation Oncology | Admitting: Radiation Oncology

## 2017-08-16 DIAGNOSIS — Z510 Encounter for antineoplastic radiation therapy: Secondary | ICD-10-CM | POA: Diagnosis not present

## 2017-08-17 ENCOUNTER — Ambulatory Visit
Admission: RE | Admit: 2017-08-17 | Discharge: 2017-08-17 | Disposition: A | Discharge status: Home / Self Care | Payer: BLUE CROSS/BLUE SHIELD | Source: Ambulatory Visit | Attending: Radiation Oncology | Admitting: Radiation Oncology

## 2017-08-17 DIAGNOSIS — Z510 Encounter for antineoplastic radiation therapy: Secondary | ICD-10-CM | POA: Diagnosis not present

## 2017-08-18 ENCOUNTER — Ambulatory Visit
Admission: RE | Admit: 2017-08-18 | Discharge: 2017-08-18 | Disposition: A | Discharge status: Home / Self Care | Payer: BLUE CROSS/BLUE SHIELD | Source: Ambulatory Visit | Attending: Radiation Oncology | Admitting: Radiation Oncology

## 2017-08-18 ENCOUNTER — Inpatient Hospital Stay: Payer: BLUE CROSS/BLUE SHIELD | Attending: Radiation Oncology

## 2017-08-18 DIAGNOSIS — Z171 Estrogen receptor negative status [ER-]: Secondary | ICD-10-CM | POA: Diagnosis not present

## 2017-08-18 DIAGNOSIS — D0511 Intraductal carcinoma in situ of right breast: Secondary | ICD-10-CM | POA: Diagnosis not present

## 2017-08-18 DIAGNOSIS — Z510 Encounter for antineoplastic radiation therapy: Secondary | ICD-10-CM | POA: Diagnosis not present

## 2017-08-18 LAB — CBC
HEMATOCRIT: 40 % (ref 35.0–47.0)
HEMOGLOBIN: 13.6 g/dL (ref 12.0–16.0)
MCH: 30.8 pg (ref 26.0–34.0)
MCHC: 34 g/dL (ref 32.0–36.0)
MCV: 90.5 fL (ref 80.0–100.0)
Platelets: 328 10*3/uL (ref 150–440)
RBC: 4.42 MIL/uL (ref 3.80–5.20)
RDW: 13.6 % (ref 11.5–14.5)
WBC: 6.3 10*3/uL (ref 3.6–11.0)

## 2017-08-19 ENCOUNTER — Ambulatory Visit
Admission: RE | Admit: 2017-08-19 | Discharge: 2017-08-19 | Disposition: A | Discharge status: Home / Self Care | Payer: BLUE CROSS/BLUE SHIELD | Source: Ambulatory Visit | Attending: Radiation Oncology | Admitting: Radiation Oncology

## 2017-08-19 DIAGNOSIS — Z510 Encounter for antineoplastic radiation therapy: Secondary | ICD-10-CM | POA: Diagnosis not present

## 2017-08-22 ENCOUNTER — Ambulatory Visit
Admission: RE | Admit: 2017-08-22 | Discharge: 2017-08-22 | Disposition: A | Discharge status: Home / Self Care | Payer: BLUE CROSS/BLUE SHIELD | Source: Ambulatory Visit | Attending: Radiation Oncology | Admitting: Radiation Oncology

## 2017-08-22 DIAGNOSIS — Z510 Encounter for antineoplastic radiation therapy: Secondary | ICD-10-CM | POA: Diagnosis not present

## 2017-08-23 ENCOUNTER — Ambulatory Visit
Admission: RE | Admit: 2017-08-23 | Discharge: 2017-08-23 | Disposition: A | Discharge status: Home / Self Care | Payer: BLUE CROSS/BLUE SHIELD | Source: Ambulatory Visit | Attending: Radiation Oncology | Admitting: Radiation Oncology

## 2017-08-23 DIAGNOSIS — Z510 Encounter for antineoplastic radiation therapy: Secondary | ICD-10-CM | POA: Diagnosis not present

## 2017-08-24 ENCOUNTER — Ambulatory Visit
Admission: RE | Admit: 2017-08-24 | Discharge: 2017-08-24 | Disposition: A | Discharge status: Home / Self Care | Payer: BLUE CROSS/BLUE SHIELD | Source: Ambulatory Visit | Attending: Radiation Oncology | Admitting: Radiation Oncology

## 2017-08-24 DIAGNOSIS — Z510 Encounter for antineoplastic radiation therapy: Secondary | ICD-10-CM | POA: Diagnosis not present

## 2017-08-25 ENCOUNTER — Ambulatory Visit
Admission: RE | Admit: 2017-08-25 | Discharge: 2017-08-25 | Disposition: A | Discharge status: Home / Self Care | Payer: BLUE CROSS/BLUE SHIELD | Source: Ambulatory Visit | Attending: Radiation Oncology | Admitting: Radiation Oncology

## 2017-08-25 ENCOUNTER — Ambulatory Visit: Payer: BLUE CROSS/BLUE SHIELD

## 2017-08-25 DIAGNOSIS — Z510 Encounter for antineoplastic radiation therapy: Secondary | ICD-10-CM | POA: Diagnosis not present

## 2017-08-26 ENCOUNTER — Ambulatory Visit
Admission: RE | Admit: 2017-08-26 | Discharge: 2017-08-26 | Disposition: A | Discharge status: Home / Self Care | Payer: BLUE CROSS/BLUE SHIELD | Source: Ambulatory Visit | Attending: Radiation Oncology | Admitting: Radiation Oncology

## 2017-08-29 DIAGNOSIS — Z510 Encounter for antineoplastic radiation therapy: Secondary | ICD-10-CM | POA: Diagnosis not present

## 2017-10-05 ENCOUNTER — Encounter (INDEPENDENT_AMBULATORY_CARE_PROVIDER_SITE_OTHER): Payer: Self-pay

## 2017-10-05 ENCOUNTER — Ambulatory Visit
Admission: RE | Admit: 2017-10-05 | Discharge: 2017-10-05 | Disposition: A | Discharge status: Home / Self Care | Payer: BLUE CROSS/BLUE SHIELD | Source: Ambulatory Visit | Attending: Radiation Oncology | Admitting: Radiation Oncology

## 2017-10-05 ENCOUNTER — Encounter: Payer: Self-pay | Admitting: Radiation Oncology

## 2017-10-05 ENCOUNTER — Other Ambulatory Visit: Payer: Self-pay

## 2017-10-05 VITALS — BP 202/96 | HR 106 | Temp 97.9°F | Resp 20 | Wt 167.7 lb

## 2017-10-05 DIAGNOSIS — Z923 Personal history of irradiation: Secondary | ICD-10-CM | POA: Insufficient documentation

## 2017-10-05 DIAGNOSIS — D0511 Intraductal carcinoma in situ of right breast: Secondary | ICD-10-CM | POA: Diagnosis not present

## 2017-10-05 DIAGNOSIS — Z171 Estrogen receptor negative status [ER-]: Secondary | ICD-10-CM | POA: Diagnosis not present

## 2017-10-05 NOTE — Progress Notes (Signed)
Radiation Oncology Follow up Note  Name: Rebekah Kelly   Date:   10/05/2017 MRN:  [id number] DOB: 08-01-1951    This 66 y.o. female presents to the clinic today for one-month follow-up status post whole breast radiation to her right breast for ductal carcinoma in situ ER/PR negative.  REFERRING PROVIDER: Tracie Harrier, MD  HPI: Patient is a 66 year old female now seen out 1 month having completed whole breast radiation to her right breast for ER/PR negative ductal carcinoma in situ. Seen today in routine follow-up she is doing well. She specifically denies breast tenderness cough or bone pain. She is not on antiestrogen therapy based on ER/PR negative nature of her disease..  COMPLICATIONS OF TREATMENT: none  FOLLOW UP COMPLIANCE: keeps appointments   PHYSICAL EXAM:  BP (!) 202/96   Pulse (!) 106   Temp 97.9 F (36.6 C)   Resp 20   Wt 167 lb 10.6 oz (76.1 kg)   BMI 29.70 kg/m  Lungs are clear to A&P cardiac examination essentially unremarkable with regular rate and rhythm. No dominant mass or nodularity is noted in either breast in 2 positions examined. Incision is well-healed. No axillary or supraclavicular adenopathy is appreciated. Cosmetic result is excellent. Well-developed well-nourished patient in NAD. HEENT reveals PERLA, EOMI, discs not visualized.  Oral cavity is clear. No oral mucosal lesions are identified. Neck is clear without evidence of cervical or supraclavicular adenopathy. Lungs are clear to A&P. Cardiac examination is essentially unremarkable with regular rate and rhythm without murmur rub or thrill. Abdomen is benign with no organomegaly or masses noted. Motor sensory and DTR levels are equal and symmetric in the upper and lower extremities. Cranial nerves II through XII are grossly intact. Proprioception is intact. No peripheral adenopathy or edema is identified. No motor or sensory levels are noted. Crude visual fields are within normal  range.  RADIOLOGY RESULTS: No current films for review  PLAN: Present time she is doing well 1 month out from whole breast radiation. I'm please were overall progress. I've asked to see her back in 4-5 months for follow-up. If follow-up mammogram is not been ordered by then will order that. Patient family know to call with any concerns.  I would like to take this opportunity to thank you for allowing me to participate in the care of your patient.Noreene Filbert, MD

## 2018-02-22 ENCOUNTER — Other Ambulatory Visit: Payer: Self-pay

## 2018-02-22 DIAGNOSIS — D0511 Intraductal carcinoma in situ of right breast: Secondary | ICD-10-CM

## 2018-03-13 ENCOUNTER — Ambulatory Visit
Admission: RE | Admit: 2018-03-13 | Discharge: 2018-03-13 | Disposition: A | Discharge status: Home / Self Care | Payer: BLUE CROSS/BLUE SHIELD | Source: Ambulatory Visit | Attending: Internal Medicine | Admitting: Internal Medicine

## 2018-03-13 ENCOUNTER — Ambulatory Visit
Admission: RE | Admit: 2018-03-13 | Discharge: 2018-03-13 | Disposition: A | Discharge status: Home / Self Care | Payer: BLUE CROSS/BLUE SHIELD | Source: Ambulatory Visit | Attending: Surgery | Admitting: Surgery

## 2018-03-13 ENCOUNTER — Other Ambulatory Visit: Payer: Self-pay

## 2018-03-13 DIAGNOSIS — D0511 Intraductal carcinoma in situ of right breast: Secondary | ICD-10-CM | POA: Insufficient documentation

## 2018-03-13 DIAGNOSIS — R928 Other abnormal and inconclusive findings on diagnostic imaging of breast: Secondary | ICD-10-CM | POA: Diagnosis present

## 2018-03-13 DIAGNOSIS — R921 Mammographic calcification found on diagnostic imaging of breast: Secondary | ICD-10-CM | POA: Diagnosis present

## 2018-03-13 HISTORY — DX: Personal history of irradiation: Z92.3

## 2018-03-15 ENCOUNTER — Encounter: Payer: Self-pay | Admitting: Surgery

## 2018-03-15 ENCOUNTER — Ambulatory Visit (INDEPENDENT_AMBULATORY_CARE_PROVIDER_SITE_OTHER): Payer: BLUE CROSS/BLUE SHIELD | Admitting: Surgery

## 2018-03-15 VITALS — BP 178/88 | HR 80 | Temp 97.9°F | Ht <= 58 in | Wt 167.0 lb

## 2018-03-15 DIAGNOSIS — D0511 Intraductal carcinoma in situ of right breast: Secondary | ICD-10-CM | POA: Diagnosis not present

## 2018-03-15 NOTE — Patient Instructions (Signed)
La vamos a referir al [employer] para que la pueda seguir.   Nosotros la vamos a ver en un ano para pedirle su mamograma y luego ver al Dr. Caroleen Hamman.

## 2018-03-15 NOTE — Progress Notes (Signed)
Outpatient Surgical Follow Up  03/15/2018  Rebekah Kelly is an 67 y.o. female.   Chief Complaint  Patient presents with  . Routine Post Op    DCIS Right Breast 06/14/17-Mammogram 03/13/18    HPI: s/p  Lumpectomy  by Dr. Hampton Abbot 6 months ago. Negative but close margins, s/p radiation rx. Pr, ER negative. Recent mammogram personally reviewed, no signs of recurrent disease or any other suspicious lesions. He has been having some intermittent sharp mild pains on the right breast and right axilla.  No fevers no chills no other complaints of her breast.  Past Medical History:  Diagnosis Date  . Anxiety   . GERD without esophagitis 05/21/2015  . HTN (hypertension) 03/11/2014  . Hypercholesterolemia 09/20/2016  . Hyperlipidemia   . Hypertension   . Personal history of radiation therapy 2018   RIGHT lumpectomy    Past Surgical History:  Procedure Laterality Date  . BREAST BIOPSY Right 05/06/2017   stereo + DCIS  . BREAST EXCISIONAL BIOPSY Right 06/14/2017   DUCTAL CARCINOMA IN SITU, NUCLEAR GRADE 2  . BREAST LUMPECTOMY Right 06/2017   DUCTAL CARCINOMA IN SITU, NUCLEAR GRADE 2  . BREAST LUMPECTOMY WITH NEEDLE LOCALIZATION Right 06/14/2017   Procedure: BREAST LUMPECTOMY WITH NEEDLE LOCALIZATION;  Surgeon: Olean Ree, MD;  Location: [facility] ORS;  Service: General;  Laterality: Right;  . COLONOSCOPY WITH PROPOFOL N/A 02/28/2017   Procedure: COLONOSCOPY WITH PROPOFOL;  Surgeon: Manya Silvas, MD;  Location: [facility] ENDOSCOPY;  Service: Endoscopy;  Laterality: N/A;  . ESOPHAGOGASTRODUODENOSCOPY (EGD) WITH PROPOFOL N/A 02/28/2017   Procedure: ESOPHAGOGASTRODUODENOSCOPY (EGD) WITH PROPOFOL;  Surgeon: Manya Silvas, MD;  Location: [facility] ENDOSCOPY;  Service: Endoscopy;  Laterality: N/A;  . TUBAL LIGATION      Family History  Problem Relation Age of Onset  . Healthy Mother   . Heart attack Father   . Breast cancer Neg Hx     Social History:  reports that she has never  smoked. She has never used smokeless tobacco. She reports that she does not drink alcohol or use drugs.  Allergies: No Known Allergies  Medications reviewed.    ROS Full ROS performed and is otherwise negative other than what is stated in HPI   BP (!) 178/88   Pulse 80   Temp 97.9 F (36.6 C) (Oral)   Ht 4\' 9"  (1.448 m)   Wt 75.8 kg (167 lb)   BMI 36.14 kg/m   Physical Exam  Constitutional: She is oriented to person, place, and time. She appears well-developed and well-nourished.  Neck: Normal range of motion. No JVD present. No tracheal deviation present. No thyromegaly present.  Pulmonary/Chest: Effort normal. No respiratory distress. She exhibits no tenderness.  BREAST: Well healed scar, no evidence of infection or seroma. No evdience of Lymphedema. Bilateral breast w/o pathological lesions  Abdominal: Soft. She exhibits no distension and no mass. There is no tenderness. There is no rebound and no guarding.  Neurological: She is alert and oriented to person, place, and time. She displays normal reflexes. No cranial nerve deficit. She exhibits normal muscle tone. Coordination normal.  Skin: Skin is warm and dry. Capillary refill takes less than 2 seconds.  Psychiatric: She has a normal mood and affect. Her behavior is normal. Judgment and thought content normal.  Nursing note and vitals reviewed.      Assessment/Plan: DCIS of the right breast status post lumpectomy and radiation. We will follow her up in 1 year with a repeat mammogram.  We will ask consultation with medical oncology to also follow her up regarding her breast cancer. Even that she is PR and ER negative benefit of endocrine therapy is unknown     Caroleen Hamman, MD [facility]

## 2018-03-24 ENCOUNTER — Encounter: Payer: Self-pay | Admitting: *Deleted

## 2018-04-03 ENCOUNTER — Ambulatory Visit: Payer: BLUE CROSS/BLUE SHIELD | Admitting: Radiation Oncology

## 2018-05-02 ENCOUNTER — Encounter: Payer: Self-pay | Admitting: Radiation Oncology

## 2018-05-02 ENCOUNTER — Ambulatory Visit
Admission: RE | Admit: 2018-05-02 | Discharge: 2018-05-02 | Disposition: A | Discharge status: Home / Self Care | Payer: BLUE CROSS/BLUE SHIELD | Source: Ambulatory Visit | Attending: Radiation Oncology | Admitting: Radiation Oncology

## 2018-05-02 ENCOUNTER — Other Ambulatory Visit: Payer: Self-pay

## 2018-05-02 VITALS — BP 191/93 | HR 97 | Temp 97.4°F | Wt 167.7 lb

## 2018-05-02 DIAGNOSIS — Z923 Personal history of irradiation: Secondary | ICD-10-CM | POA: Insufficient documentation

## 2018-05-02 DIAGNOSIS — D0511 Intraductal carcinoma in situ of right breast: Secondary | ICD-10-CM | POA: Diagnosis present

## 2018-05-02 NOTE — Progress Notes (Signed)
Radiation Oncology Follow up Note  Name: Rebekah Kelly   Date:   05/02/2018 MRN:  [id number] DOB: [birth year]    This 67 y.o. female presents to the clinic today for 6 month follow-up status post whole breast radiation to her right breast for ductal carcinoma in situ ER/PR negative.  REFERRING PROVIDER: Tracie Harrier, MD  HPI: patient is a 67 year old female now seen out 6 months having completed whole breast radiation to her right breast for ductal carcinoma in situ ER/PR positive. She is seen today in routine follow-up and is doing well. She specifically denies breast tenderness cough or bone pain. She is not on antiestrogen therapy based on the negative ER/PR nature of her ductal carcinoma in situ..she mammogram last month which I have reviewed BI-RADS 2 benign.  COMPLICATIONS OF TREATMENT: none  FOLLOW UP COMPLIANCE: keeps appointments   PHYSICAL EXAM:  BP (!) 191/93   Pulse 97   Temp (!) 97.4 F (36.3 C)   Wt 167 lb 10.6 oz (76.1 kg)   BMI 36.28 kg/m  Lungs are clear to A&P cardiac examination essentially unremarkable with regular rate and rhythm. No dominant mass or nodularity is noted in either breast in 2 positions examined. Incision is well-healed. No axillary or supraclavicular adenopathy is appreciated. Cosmetic result is excellent. Well-developed well-nourished patient in NAD. HEENT reveals PERLA, EOMI, discs not visualized.  Oral cavity is clear. No oral mucosal lesions are identified. Neck is clear without evidence of cervical or supraclavicular adenopathy. Lungs are clear to A&P. Cardiac examination is essentially unremarkable with regular rate and rhythm without murmur rub or thrill. Abdomen is benign with no organomegaly or masses noted. Motor sensory and DTR levels are equal and symmetric in the upper and lower extremities. Cranial nerves II through XII are grossly intact. Proprioception is intact. No peripheral adenopathy or edema is identified. No  motor or sensory levels are noted. Crude visual fields are within normal range.  RADIOLOGY RESULTS: mammograms are reviewed and compatible above-stated findings  PLAN: present time patient is doing well with no evidence of disease. I'm please were overall progress. I've asked to see her back in 6 months for follow-up and then will start once year follow-up follow-up visits.patient is to call with any concerns. Translator was present for the entire xam.  I would like to take this opportunity to thank you for allowing me to participate in the care of your patient.Noreene Filbert, MD

## 2018-09-29 ENCOUNTER — Other Ambulatory Visit: Payer: Self-pay | Admitting: Internal Medicine

## 2018-09-29 DIAGNOSIS — K219 Gastro-esophageal reflux disease without esophagitis: Secondary | ICD-10-CM

## 2018-09-29 DIAGNOSIS — R1010 Upper abdominal pain, unspecified: Secondary | ICD-10-CM

## 2018-10-12 ENCOUNTER — Ambulatory Visit
Admission: RE | Admit: 2018-10-12 | Discharge: 2018-10-12 | Disposition: A | Discharge status: Home / Self Care | Payer: BLUE CROSS/BLUE SHIELD | Source: Ambulatory Visit | Attending: Internal Medicine | Admitting: Internal Medicine

## 2018-10-12 DIAGNOSIS — R1010 Upper abdominal pain, unspecified: Secondary | ICD-10-CM | POA: Insufficient documentation

## 2018-10-12 DIAGNOSIS — K219 Gastro-esophageal reflux disease without esophagitis: Secondary | ICD-10-CM | POA: Diagnosis not present

## 2018-10-13 IMAGING — MG MM BREAST SURGICAL SPECIMEN
1 series · 1 of 1 positions shown · non-contrast
Comparison: Previous exam(s).

CLINICAL DATA: Needle localization was performed of the right
breast today prior to lumpectomy.

EXAM:
SPECIMEN RADIOGRAPH OF THE RIGHT BREAST

[R SPECIMEN]
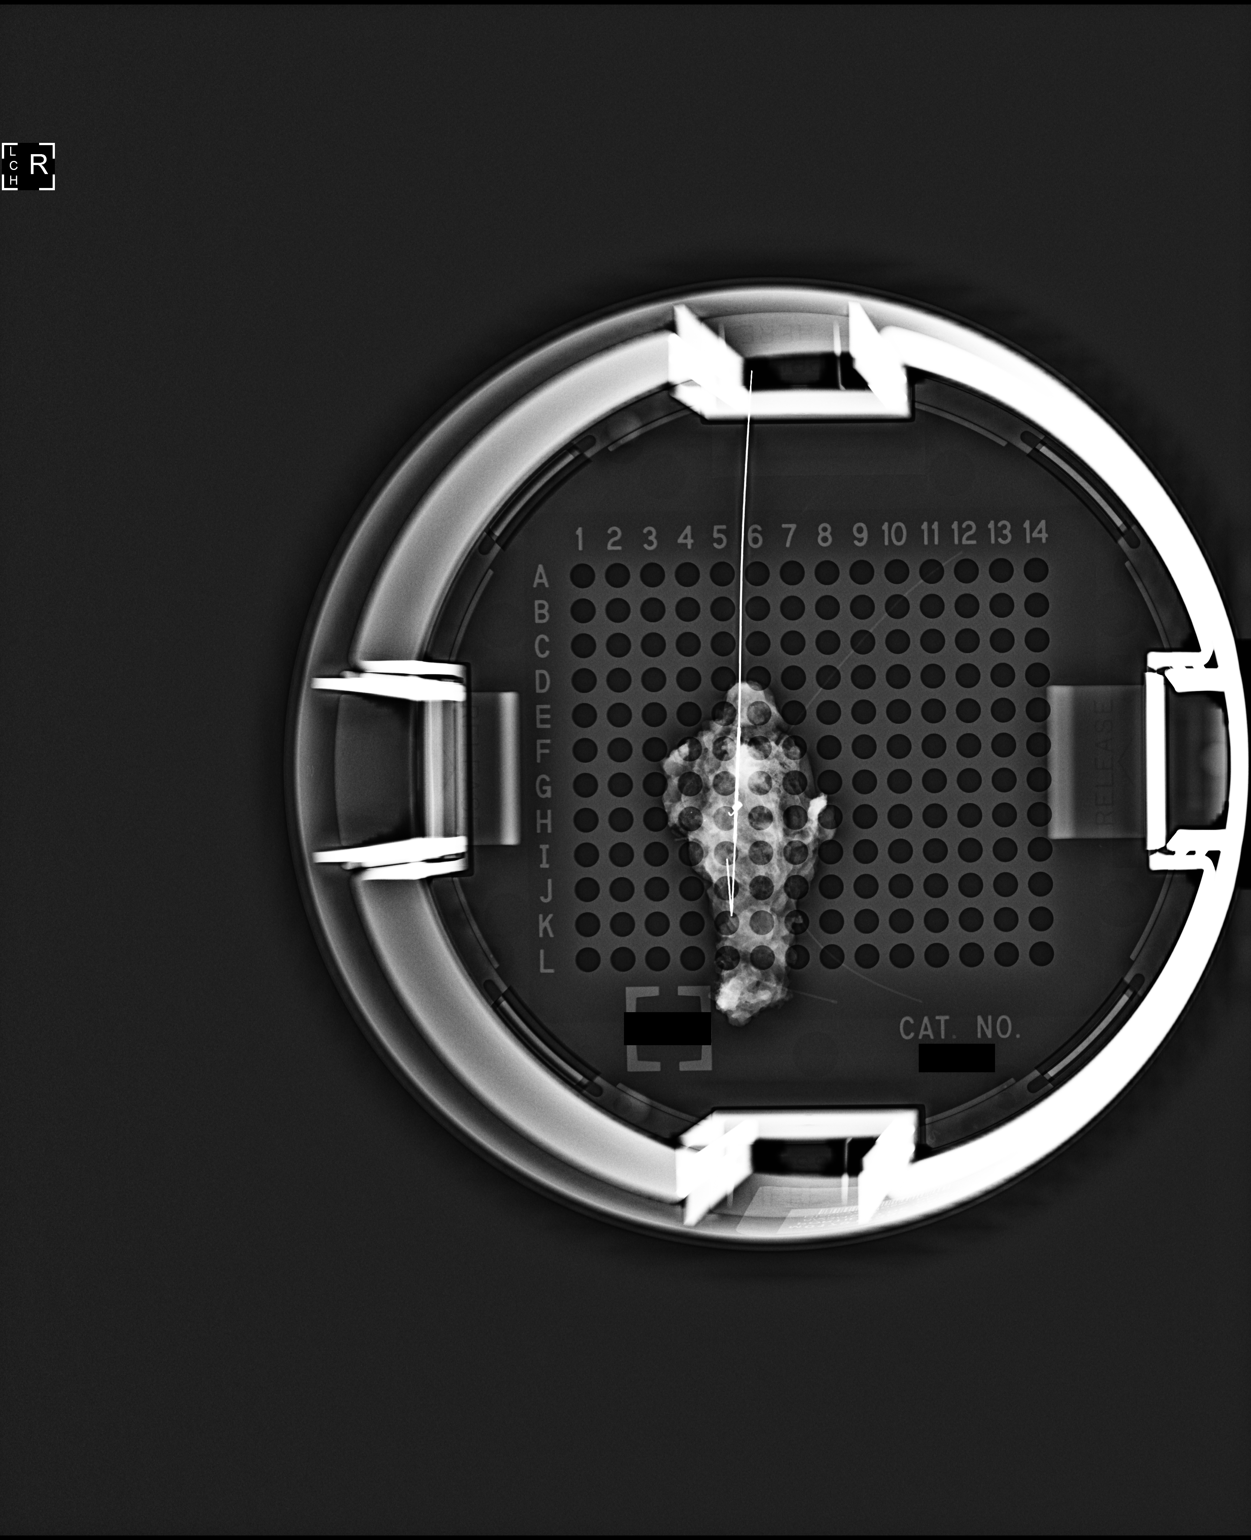

[1 of 1 positions shown; findings below may reference images not displayed]

FINDINGS: Status post excision of the right breast. The wire tip and biopsy
marker clip are present and are marked for pathology.
IMPRESSION: Specimen radiograph of the right breast.

## 2018-10-13 IMAGING — MG MM PLC BREAST LOC DEV 1ST LESION INC*R*
5 series · 5 of 5 positions shown · non-contrast
Comparison: Previous exams.

CLINICAL DATA: Needle localization of the right breast performed
today prior to lumpectomy. Spanish interpreter was present
throughout the procedure.

EXAM:
NEEDLE LOCALIZATION OF THE RIGHT BREAST WITH MAMMO GUIDANCE

[R CC (1 of 3)]
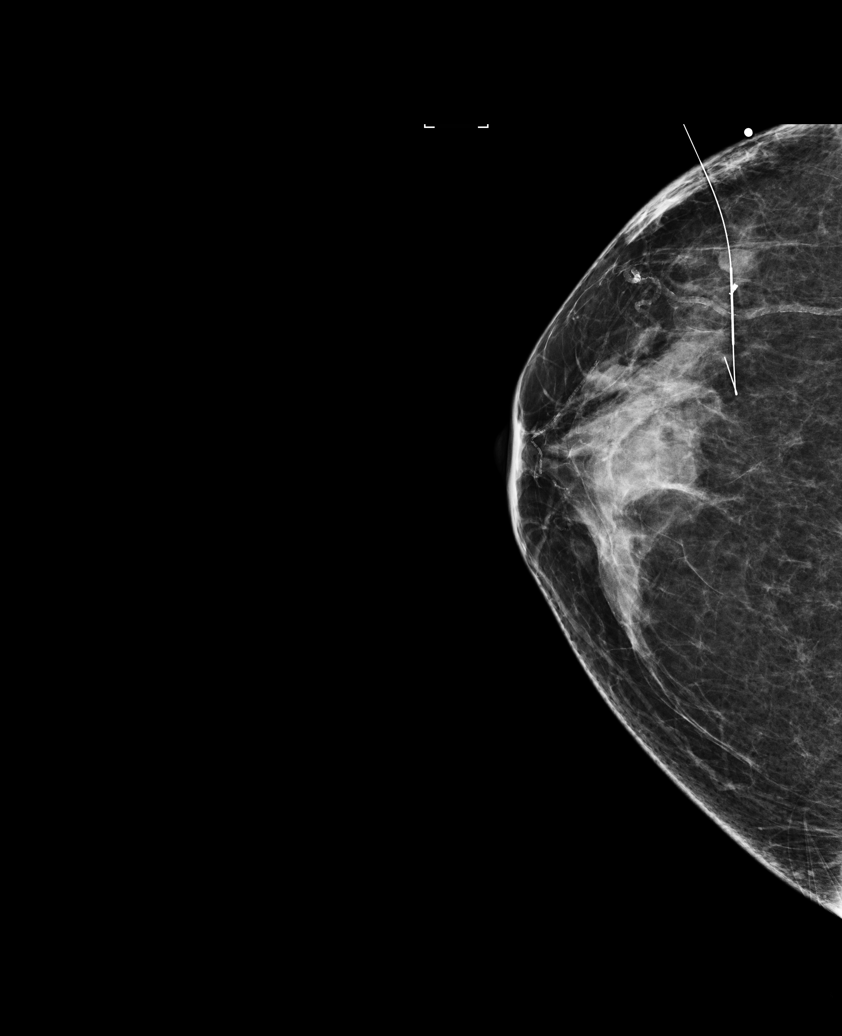

[R LM (1 of 2)]
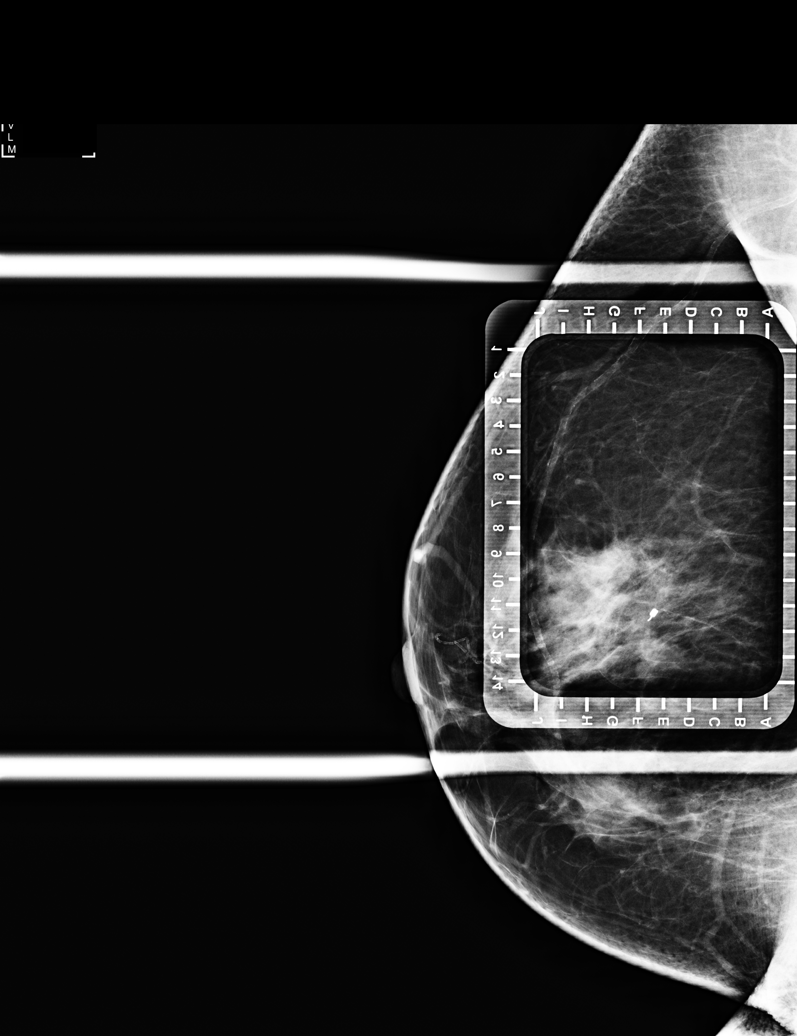

[R CC (2 of 3)]
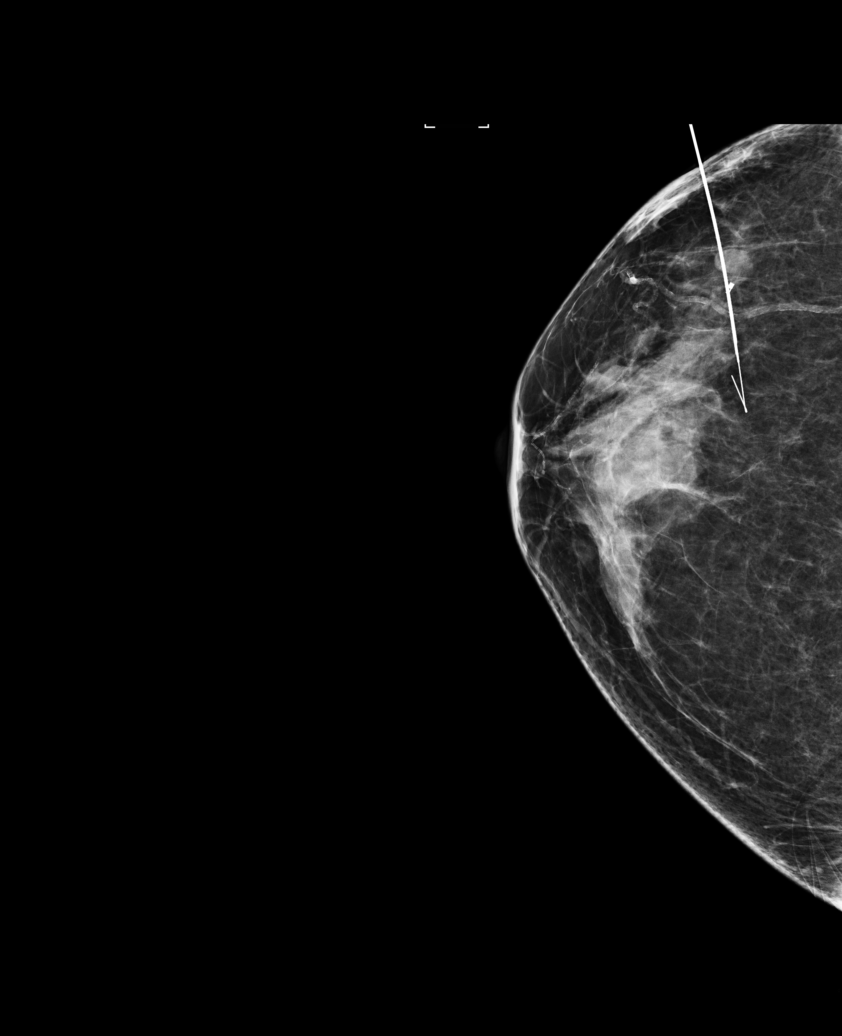

[R CC (3 of 3)]
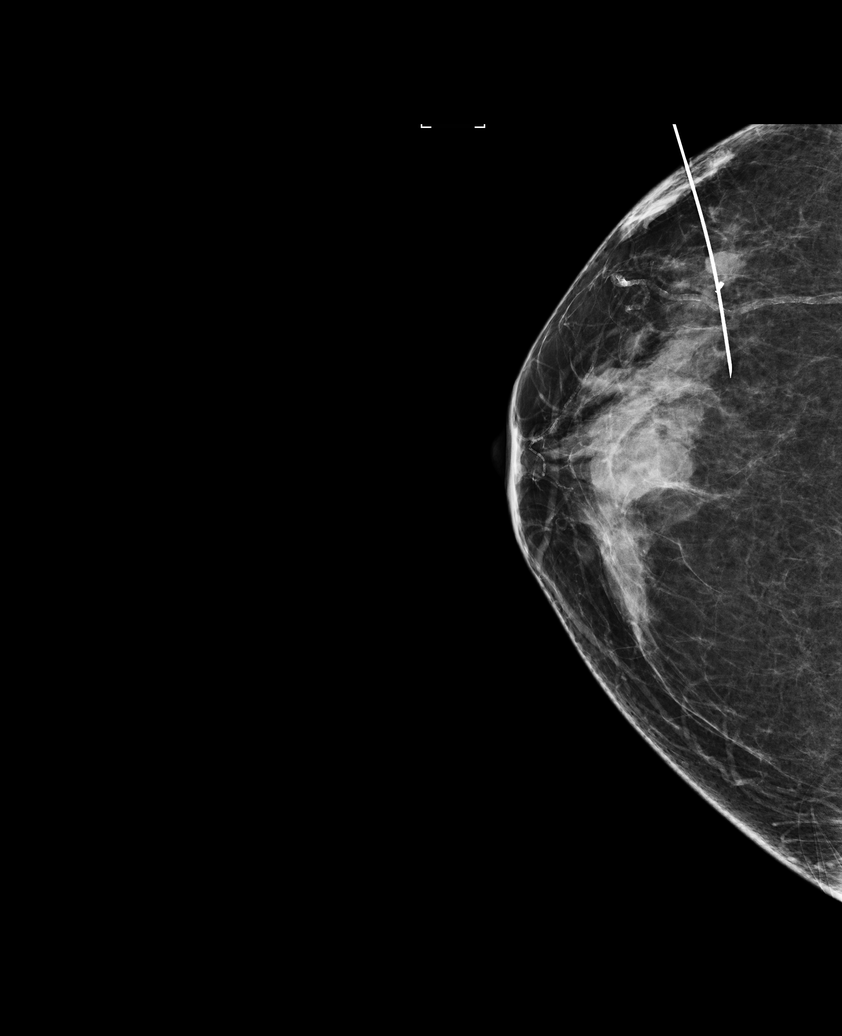

[R LM (2 of 2)]
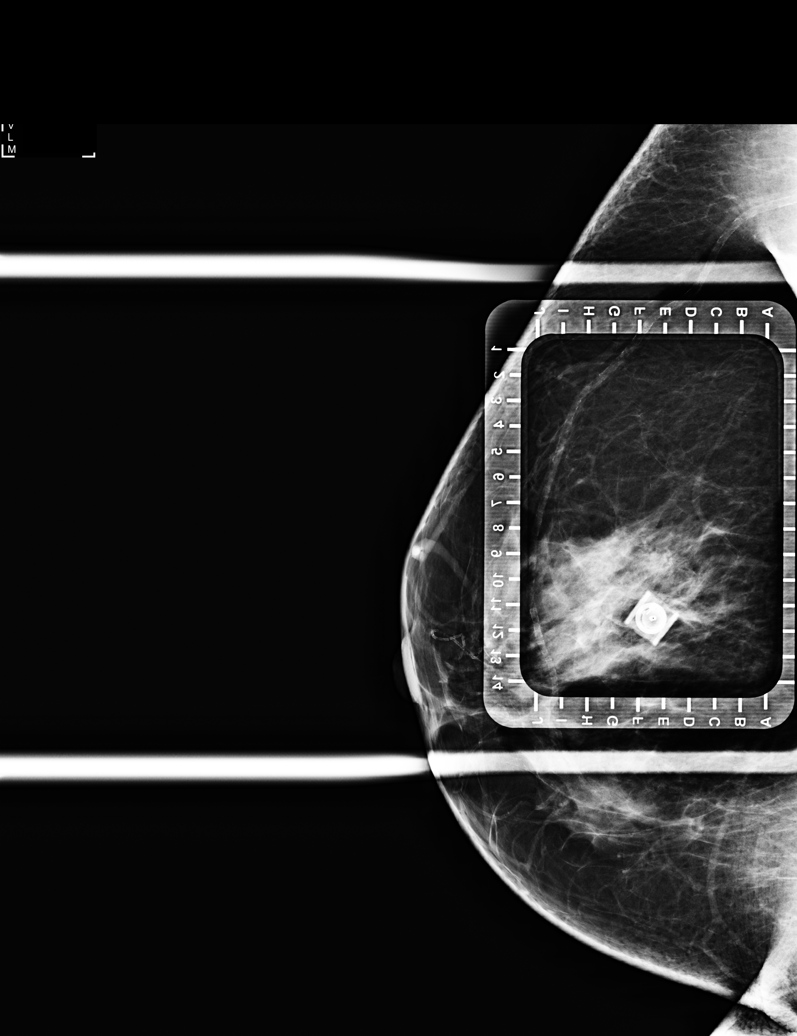

[5 of 5 positions shown; findings below may reference images not displayed]

FINDINGS: Patient presents for needle localization prior to lumpectomy. I met
with the patient and we discussed the procedure of needle
localization including benefits and alternatives. We discussed the
high likelihood of a successful procedure. We discussed the risks of
the procedure, including infection, bleeding, tissue injury, and
further surgery. Informed, written consent was given. The usual
time-out protocol was performed immediately prior to the procedure.

Using mammographic guidance, sterile technique, 1% lidocaine and a 7
cm modified Kopans needle, a coil shaped biopsy clip was localized
using a lateral to medial approach. The images were marked for Dr.
Silve.
IMPRESSION: Needle localization right breast. No apparent complications.

## 2018-11-02 ENCOUNTER — Encounter: Payer: Self-pay | Admitting: Radiation Oncology

## 2018-11-02 ENCOUNTER — Other Ambulatory Visit: Payer: Self-pay

## 2018-11-02 ENCOUNTER — Ambulatory Visit
Admission: RE | Admit: 2018-11-02 | Discharge: 2018-11-02 | Disposition: A | Discharge status: Home / Self Care | Payer: BLUE CROSS/BLUE SHIELD | Source: Ambulatory Visit | Attending: Radiation Oncology | Admitting: Radiation Oncology

## 2018-11-02 VITALS — BP 194/98 | HR 89 | Temp 97.9°F | Resp 18 | Wt 165.8 lb

## 2018-11-02 DIAGNOSIS — Z171 Estrogen receptor negative status [ER-]: Principal | ICD-10-CM

## 2018-11-02 DIAGNOSIS — C50411 Malignant neoplasm of upper-outer quadrant of right female breast: Secondary | ICD-10-CM

## 2018-11-02 NOTE — Progress Notes (Signed)
Radiation Oncology Follow up Note  Name: Rebekah Kelly   Date:   11/02/2018 MRN:  [id number] DOB: 1951/07/18    This 68 y.o. female presents to the clinic today for 1 year follow-up status post whole breast radiation to right breast for ductal carcinoma in situ ER/PR negative.  REFERRING PROVIDER: Tracie Harrier, MD  HPI: patient is a61 year old Spanish female now out 1 year having her whole breast radiation to her right breast for ductal carcinoma in situ ER/PR negative. Seen today in routine follow-up she isdoing well still has some pain and some slight tenderness in her right axilla most likely scar tissue. She otherwise specifically denies breast tenderness cough or bone pain. Had a mammogram back in.June which I have reviewed was BI-RADS 2 benign.she is not on antiestrogen therapy  COMPLICATIONS OF TREATMENT: none  FOLLOW UP COMPLIANCE: keeps appointments   PHYSICAL EXAM:  BP (!) 194/98 (BP Location: Left Arm, Patient Position: Sitting)   Pulse 89   Temp 97.9 F (36.6 C) (Tympanic)   Resp 18   Wt 165 lb 12.6 oz (75.2 kg)   BMI 35.88 kg/m  Lungs are clear to A&P cardiac examination essentially unremarkable with regular rate and rhythm. No dominant mass or nodularity is noted in either breast in 2 positions examined. Incision is well-healed. No axillary or supraclavicular adenopathy is appreciated. Cosmetic result is excellent.I can detect nothing in her right axilla which would warrant her pain.Well-developed well-nourished patient in NAD. HEENT reveals PERLA, EOMI, discs not visualized.  Oral cavity is clear. No oral mucosal lesions are identified. Neck is clear without evidence of cervical or supraclavicular adenopathy. Lungs are clear to A&P. Cardiac examination is essentially unremarkable with regular rate and rhythm without murmur rub or thrill. Abdomen is benign with no organomegaly or masses noted. Motor sensory and DTR levels are equal and symmetric in the  upper and lower extremities. Cranial nerves II through XII are grossly intact. Proprioception is intact. No peripheral adenopathy or edema is identified. No motor or sensory levels are noted. Crude visual fields are within normal range.  RADIOLOGY RESULTS: mammogram reviewed and compatible with the above-stated findings  PLAN: at the present time patient is doing well with no evidence of disease. I am please were overall progress. I've asked to see her back in 1 year for follow-up. Patient is to call sooner with any concerns.  I would like to take this opportunity to thank you for allowing me to participate in the care of your patient.Noreene Filbert, MD

## 2019-02-22 ENCOUNTER — Other Ambulatory Visit: Payer: Self-pay | Admitting: Internal Medicine

## 2019-02-22 DIAGNOSIS — D0510 Intraductal carcinoma in situ of unspecified breast: Secondary | ICD-10-CM

## 2019-02-22 DIAGNOSIS — Z1239 Encounter for other screening for malignant neoplasm of breast: Secondary | ICD-10-CM

## 2019-03-15 ENCOUNTER — Ambulatory Visit
Admission: RE | Admit: 2019-03-15 | Discharge: 2019-03-15 | Disposition: A | Discharge status: Home / Self Care | Payer: BLUE CROSS/BLUE SHIELD | Source: Ambulatory Visit | Attending: Internal Medicine | Admitting: Internal Medicine

## 2019-03-15 ENCOUNTER — Other Ambulatory Visit: Payer: Self-pay

## 2019-03-15 DIAGNOSIS — Z1239 Encounter for other screening for malignant neoplasm of breast: Secondary | ICD-10-CM

## 2019-03-15 DIAGNOSIS — D0510 Intraductal carcinoma in situ of unspecified breast: Secondary | ICD-10-CM

## 2019-03-15 DIAGNOSIS — Z86000 Personal history of in-situ neoplasm of breast: Secondary | ICD-10-CM | POA: Insufficient documentation

## 2019-11-01 ENCOUNTER — Encounter (INDEPENDENT_AMBULATORY_CARE_PROVIDER_SITE_OTHER): Payer: Self-pay

## 2019-11-01 ENCOUNTER — Ambulatory Visit
Admission: RE | Admit: 2019-11-01 | Discharge: 2019-11-01 | Disposition: A | Discharge status: Home / Self Care | Payer: BC Managed Care – PPO | Source: Ambulatory Visit | Attending: Radiation Oncology | Admitting: Radiation Oncology

## 2019-11-01 ENCOUNTER — Encounter: Payer: Self-pay | Admitting: Radiation Oncology

## 2019-11-01 ENCOUNTER — Other Ambulatory Visit: Payer: Self-pay

## 2019-11-01 VITALS — BP 192/102 | HR 88 | Temp 98.0°F | Resp 16 | Wt 165.4 lb

## 2019-11-01 DIAGNOSIS — C50411 Malignant neoplasm of upper-outer quadrant of right female breast: Secondary | ICD-10-CM

## 2019-11-01 DIAGNOSIS — Z86000 Personal history of in-situ neoplasm of breast: Secondary | ICD-10-CM | POA: Diagnosis present

## 2019-11-01 DIAGNOSIS — Z923 Personal history of irradiation: Secondary | ICD-10-CM | POA: Diagnosis not present

## 2019-11-01 NOTE — Progress Notes (Signed)
Radiation Oncology Follow up Note  Name: Rebekah Kelly   Date:   11/01/2019 MRN:  [id number] DOB: 1950-12-20    This 69 y.o. female presents to the clinic today for 2-year follow-up status post whole breast radiation to her right breast for ductal carcinoma in situ ER/PR negative.  REFERRING PROVIDER: Tracie Harrier, MD  HPI: Patient is a 69 year old Spanish speaking female accompanied by interpreter seen today for 2-year follow-up status post whole breast radiation to her right breast for ER/PR negative ductal carcinoma in situ.  Seen today in routine follow-up she is doing well still has some slight tenderness in her axilla secondary to scarring.  She otherwise specifically denies breast tenderness cough or bone pain..  She had a mammogram back in June which I have reviewed was BI-RADS 2 benign.  She is not on antiestrogen therapy based on the ER/PR negative nature of her disease  COMPLICATIONS OF TREATMENT: none  FOLLOW UP COMPLIANCE: keeps appointments   PHYSICAL EXAM:  BP (!) 192/102 (BP Location: Right Arm) Comment (BP Location): manual  Pulse 88   Temp 98 F (36.7 C)   Resp 16   Wt 165 lb 6.4 oz (75 kg)   SpO2 97%   BMI 35.79 kg/m  Lungs are clear to A&P cardiac examination essentially unremarkable with regular rate and rhythm. No dominant mass or nodularity is noted in either breast in 2 positions examined. Incision is well-healed. No axillary or supraclavicular adenopathy is appreciated. Cosmetic result is excellent.  Well-developed well-nourished patient in NAD. HEENT reveals PERLA, EOMI, discs not visualized.  Oral cavity is clear. No oral mucosal lesions are identified. Neck is clear without evidence of cervical or supraclavicular adenopathy. Lungs are clear to A&P. Cardiac examination is essentially unremarkable with regular rate and rhythm without murmur rub or thrill. Abdomen is benign with no organomegaly or masses noted. Motor sensory and DTR levels  are equal and symmetric in the upper and lower extremities. Cranial nerves II through XII are grossly intact. Proprioception is intact. No peripheral adenopathy or edema is identified. No motor or sensory levels are noted. Crude visual fields are within normal range.  RADIOLOGY RESULTS: Mammograms reviewed compatible with above-stated findings  PLAN: At the present time patient is doing well with no evidence of disease now 2 years out I am pleased with her overall progress.  I have asked to see her back in 1 year for follow-up.  Patient is to call with any concerns.  I would like to take this opportunity to thank you for allowing me to participate in the care of your patient.Noreene Filbert, MD

## 2020-03-05 ENCOUNTER — Other Ambulatory Visit: Payer: Self-pay | Admitting: Internal Medicine

## 2020-03-05 DIAGNOSIS — D0510 Intraductal carcinoma in situ of unspecified breast: Secondary | ICD-10-CM

## 2020-03-18 ENCOUNTER — Ambulatory Visit
Admission: RE | Admit: 2020-03-18 | Discharge: 2020-03-18 | Disposition: A | Discharge status: Home / Self Care | Payer: BC Managed Care – PPO | Source: Ambulatory Visit | Attending: Internal Medicine | Admitting: Internal Medicine

## 2020-03-18 DIAGNOSIS — D0510 Intraductal carcinoma in situ of unspecified breast: Secondary | ICD-10-CM | POA: Insufficient documentation

## 2020-06-11 DIAGNOSIS — Z6835 Body mass index (BMI) 35.0-35.9, adult: Secondary | ICD-10-CM | POA: Insufficient documentation

## 2020-10-30 ENCOUNTER — Ambulatory Visit
Admission: RE | Admit: 2020-10-30 | Discharge: 2020-10-30 | Disposition: A | Discharge status: Home / Self Care | Payer: BC Managed Care – PPO | Source: Ambulatory Visit | Attending: Radiation Oncology | Admitting: Radiation Oncology

## 2020-10-30 VITALS — BP 174/91 | HR 94 | Temp 97.8°F | Resp 16 | Wt 154.6 lb

## 2020-10-30 DIAGNOSIS — Z923 Personal history of irradiation: Secondary | ICD-10-CM | POA: Insufficient documentation

## 2020-10-30 DIAGNOSIS — Z86000 Personal history of in-situ neoplasm of breast: Secondary | ICD-10-CM | POA: Insufficient documentation

## 2020-10-30 DIAGNOSIS — C50411 Malignant neoplasm of upper-outer quadrant of right female breast: Secondary | ICD-10-CM

## 2020-10-30 NOTE — Progress Notes (Signed)
Radiation Oncology Follow up Note  Name: Rebekah Kelly   Date:   10/30/2020 MRN:  [id number] DOB: [birth year]    This 70 y.o. 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  Seen today in routine follow-up she is doing well she specifically denies rest tenderness cough or bone pain..  Patient had mammograms back in June which I have reviewed were BI-RADS 2 benign.  She is not on antiestrogen therapy.  COMPLICATIONS OF TREATMENT: none  FOLLOW UP COMPLIANCE: keeps appointments   PHYSICAL EXAM:  BP (!) 174/91 (BP Location: Left Arm, Patient Position: Sitting, Cuff Size: Large)   Pulse 94   Temp 97.8 F (36.6 C) (Tympanic)   Resp 16   Wt 154 lb 9.6 oz (70.1 kg)   BMI 33.46 kg/m  Lungs are clear to A&P cardiac examination essentially unremarkable with regular rate and rhythm. No dominant mass or nodularity is noted in either breast in 2 positions examined. Incision is well-healed. No axillary or supraclavicular adenopathy is appreciated. Cosmetic result is excellent.  Well-developed I would like to take this opportunity to thank you for allowing me to participate in the care of your patient..  W  Ll-nourished patient in NAD. HEENT reveals PERLA, EOMI, discs not visualized.  Oral cavity is clear. No oral mucosal lesions are identified. Neck is clear without evidence of cervical or supraclavicular adenopathy. Lungs are clear to A&P. Cardiac examination is essentially unremarkable with regular rate and rhythm without murmur rub or thrill. Abdomen is benign with no organomegaly or masses noted. Motor sensory  and DTR levels are equal and symmetric in the upper and lower extremities. Cranial nerves II through XII are grossly intact. Proprioception is intact. No peripheral adenopathy or edema is identified. No motor or sensory levels are noted. Crude visual fields are within normal range.  RADIOLOGY RESULTS: Mammograms reviewed compatible with above-stated findings  PLAN: Present time patient is doing well with no evidence of disease now at 3 years I am pleased with her overall progress.  I have asked to see her back in 1 year for follow-up.  She is already scheduled for follow-up mammograms.  Patient knows to call with any concerns.    Noreene Filbert, MD

## 2021-01-19 ENCOUNTER — Other Ambulatory Visit: Payer: Self-pay

## 2021-01-19 ENCOUNTER — Emergency Department
Admission: EM | Admit: 2021-01-19 | Discharge: 2021-01-19 | Disposition: A | Discharge status: Home / Self Care | Payer: BC Managed Care – PPO | Attending: Emergency Medicine | Admitting: Emergency Medicine

## 2021-01-19 ENCOUNTER — Emergency Department: Payer: BC Managed Care – PPO

## 2021-01-19 ENCOUNTER — Encounter: Payer: Self-pay | Admitting: Emergency Medicine

## 2021-01-19 DIAGNOSIS — R1030 Lower abdominal pain, unspecified: Secondary | ICD-10-CM | POA: Insufficient documentation

## 2021-01-19 DIAGNOSIS — Z79899 Other long term (current) drug therapy: Secondary | ICD-10-CM | POA: Insufficient documentation

## 2021-01-19 DIAGNOSIS — M791 Myalgia, unspecified site: Secondary | ICD-10-CM | POA: Insufficient documentation

## 2021-01-19 DIAGNOSIS — I10 Essential (primary) hypertension: Secondary | ICD-10-CM | POA: Insufficient documentation

## 2021-01-19 DIAGNOSIS — Z7982 Long term (current) use of aspirin: Secondary | ICD-10-CM | POA: Insufficient documentation

## 2021-01-19 DIAGNOSIS — S161XXA Strain of muscle, fascia and tendon at neck level, initial encounter: Secondary | ICD-10-CM | POA: Diagnosis not present

## 2021-01-19 DIAGNOSIS — R519 Headache, unspecified: Secondary | ICD-10-CM | POA: Insufficient documentation

## 2021-01-19 DIAGNOSIS — M7918 Myalgia, other site: Secondary | ICD-10-CM

## 2021-01-19 DIAGNOSIS — E876 Hypokalemia: Secondary | ICD-10-CM | POA: Insufficient documentation

## 2021-01-19 DIAGNOSIS — Z853 Personal history of malignant neoplasm of breast: Secondary | ICD-10-CM | POA: Insufficient documentation

## 2021-01-19 DIAGNOSIS — Y92411 Interstate highway as the place of occurrence of the external cause: Secondary | ICD-10-CM | POA: Diagnosis not present

## 2021-01-19 DIAGNOSIS — S199XXA Unspecified injury of neck, initial encounter: Secondary | ICD-10-CM | POA: Diagnosis present

## 2021-01-19 LAB — URINALYSIS, COMPLETE (UACMP) WITH MICROSCOPIC
Bacteria, UA: NONE SEEN
Bilirubin Urine: NEGATIVE
Glucose, UA: NEGATIVE mg/dL
Hgb urine dipstick: NEGATIVE
Ketones, ur: NEGATIVE mg/dL
Leukocytes,Ua: NEGATIVE
Nitrite: NEGATIVE
Protein, ur: NEGATIVE mg/dL
Specific Gravity, Urine: 1.005 (ref 1.005–1.030)
pH: 8 (ref 5.0–8.0)

## 2021-01-19 LAB — CBC WITH DIFFERENTIAL/PLATELET
Abs Immature Granulocytes: 0.03 10*3/uL (ref 0.00–0.07)
Basophils Absolute: 0.1 10*3/uL (ref 0.0–0.1)
Basophils Relative: 1 %
Eosinophils Absolute: 0.2 10*3/uL (ref 0.0–0.5)
Eosinophils Relative: 2 %
HCT: 40.7 % (ref 36.0–46.0)
Hemoglobin: 14.3 g/dL (ref 12.0–15.0)
Immature Granulocytes: 0 %
Lymphocytes Relative: 18 %
Lymphs Abs: 1.9 10*3/uL (ref 0.7–4.0)
MCH: 30.9 pg (ref 26.0–34.0)
MCHC: 35.1 g/dL (ref 30.0–36.0)
MCV: 87.9 fL (ref 80.0–100.0)
Monocytes Absolute: 0.8 10*3/uL (ref 0.1–1.0)
Monocytes Relative: 7 %
Neutro Abs: 7.5 10*3/uL (ref 1.7–7.7)
Neutrophils Relative %: 72 %
Platelets: 399 10*3/uL (ref 150–400)
RBC: 4.63 MIL/uL (ref 3.87–5.11)
RDW: 13.2 % (ref 11.5–15.5)
WBC: 10.5 10*3/uL (ref 4.0–10.5)
nRBC: 0 % (ref 0.0–0.2)

## 2021-01-19 LAB — BASIC METABOLIC PANEL
Anion gap: 11 (ref 5–15)
BUN: 13 mg/dL (ref 8–23)
CO2: 28 mmol/L (ref 22–32)
Calcium: 9.3 mg/dL (ref 8.9–10.3)
Chloride: 99 mmol/L (ref 98–111)
Creatinine, Ser: 0.64 mg/dL (ref 0.44–1.00)
GFR, Estimated: >60 mL/min (ref >60)
Glucose, Bld: 100 mg/dL — ABNORMAL HIGH (ref 70–99)
Potassium: 2.6 mmol/L — CL (ref 3.5–5.1)
Sodium: 138 mmol/L (ref 135–145)

## 2021-01-19 MED ORDER — CYCLOBENZAPRINE HCL 5 MG PO TABS: 5.0000 mg | ORAL_TABLET | Freq: Three times a day (TID) | ORAL | 0 refills | Status: DC | PRN

## 2021-01-19 MED ORDER — IOHEXOL 300 MG/ML  SOLN
100.0000 mL | Freq: Once | INTRAMUSCULAR | Status: AC | PRN
  Administered 2021-01-19: 100 mL via INTRAVENOUS
  Filled 2021-01-19: qty 100

## 2021-01-19 MED ORDER — LIDOCAINE 5 % EX PTCH
1.0000 | MEDICATED_PATCH | CUTANEOUS | Status: DC
  Administered 2021-01-19: 1 via TRANSDERMAL
  Filled 2021-01-19: qty 1

## 2021-01-19 MED ORDER — LIDOCAINE 5 % EX PTCH: 1.0000 | MEDICATED_PATCH | Freq: Two times a day (BID) | CUTANEOUS | 0 refills | Status: DC

## 2021-01-19 MED ORDER — POTASSIUM CHLORIDE ER 10 MEQ PO TBCR: 10.0000 meq | EXTENDED_RELEASE_TABLET | Freq: Every day | ORAL | 0 refills | Status: DC
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
## 2021-01-19 NOTE — Discharge Instructions (Addendum)
No acute findings on CT of the head, neck, and abdomen.  Follow discharge care instruction take medication as directed.  Advise extra strength Tylenol as needed for additional pain relief.  It was noted that your potassium level was low today.  You have been started on oral medication.  Follow-up with PCP to recheck your levels in 2 to 3 weeks.
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
## 2021-01-29 ENCOUNTER — Other Ambulatory Visit: Payer: Self-pay | Admitting: Internal Medicine

## 2021-01-29 DIAGNOSIS — Z1231 Encounter for screening mammogram for malignant neoplasm of breast: Secondary | ICD-10-CM

## 2021-03-19 ENCOUNTER — Other Ambulatory Visit: Payer: Self-pay

## 2021-03-19 ENCOUNTER — Ambulatory Visit
Admission: RE | Admit: 2021-03-19 | Discharge: 2021-03-19 | Disposition: A | Discharge status: Home / Self Care | Payer: BC Managed Care – PPO | Source: Ambulatory Visit | Attending: Internal Medicine | Admitting: Internal Medicine

## 2021-03-19 DIAGNOSIS — Z1231 Encounter for screening mammogram for malignant neoplasm of breast: Secondary | ICD-10-CM | POA: Insufficient documentation

## 2021-06-18 ENCOUNTER — Encounter: Payer: Self-pay | Admitting: *Deleted

## 2021-09-11 ENCOUNTER — Ambulatory Visit: Payer: Self-pay | Admitting: General Surgery
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
## 2021-09-17 ENCOUNTER — Other Ambulatory Visit: Payer: Self-pay

## 2021-09-17 ENCOUNTER — Encounter
Admission: RE | Admit: 2021-09-17 | Discharge: 2021-09-17 | Disposition: A | Discharge status: Home / Self Care | Payer: Medicaid Other | Source: Ambulatory Visit | Attending: General Surgery | Admitting: General Surgery

## 2021-09-17 NOTE — Patient Instructions (Addendum)
Su procedimiento est programado para: mircoles 14 de diciembre [place] en el [facility], vaya al [place], mostrador de informacin [place]. Para averiguar su hora de llegada, llame al [phone] entre la 1 p. m. y las 3 p. m. el: martes 13 de diciembre  RECUERDA: Las instrucciones que no se siguen por Paramedic en un riesgo mdico grave, que puede incluir la Regent; o segn el criterio de su cirujano y Environmental health practitioner, es posible que sea Firefighter su Leisure centre manager.  No coma alimentos despus de la medianoche de la noche anterior a la ciruga. No masticar chicle, pastillas o caramelos duros.  Sin embargo, puede beber lquidos [employer] 2 horas antes de la fecha prevista para la [place]. No beba nada dentro de las 2 horas de su hora de llegada programada.  Los lquidos claros incluyen: - agua - jugo de manzana sin pulpa - gatorade (no ROJO, MORADO O AZUL) - caf o t solo (NO agregue leche o cremas al caf o t) NO beba nada que no est en esta lista.  TOME ESTOS MEDICAMENTOS LA [place] DE LA CIRUGA CON UN SORBO DE AGUA:  1. amlodipina  Una semana antes de la ciruga: a partir del 7 de diciembre Detener los antiinflamatorios (AINE) como Advil, Aleve, Ibuprofen, Motrin, Naproxen, Naprosyn y productos a base de aspirina como Excedrin, Goodys Powder, [employer]. Suspenda CUALQUIER suplemento de venta libre hasta despus de la [place]. Detener la vitamina D Sin embargo, puede continuar tomando Tylenol si es necesario para el dolor hasta el da de la [place].  No alcohol por 24 horas antes o despus de la [place].  En la maana de la ciruga cepllese los dientes con pasta dental y agua, [place] enjuagarse la boca con enjuague bucal si lo desea. No trague ninguna pasta de dientes o enjuague bucal.  Use CHG Soap como se indica en la hoja de instrucciones.  No use joyas, maquillaje, horquillas para el cabello, clips o esmalte de uas.  No use  lociones, talcos ni perfumes.  No se afeite el cuerpo desde el cuello hacia abajo 48 horas antes de la ciruga en caso de que se corte, lo que podra dejar un sitio para la infeccin. Adems, la piel recin afeitada puede irritarse si se [place] el jabn CHG.  No lleve objetos de valor al hospital. [facility] no se responsabiliza por pertenencias u objetos de valor extraviados o perdidos.  Informe a su mdico si hay algn cambio en su condicin mdica (resfriado, fiebre, infeccin).  Use ropa cmoda (especfica para su tipo de [place]) al hospital.  Si le dan de alta el da de la [place], no se le permitir conducir hasta su casa. Necesitar un adulto responsable (mayor de 18 aos) que lo lleve a su casa y se quede con usted esa noche.  Si viaja en transporte pblico, deber ir acompaado de un adulto responsable (mayor de 18 aos). Confirme con su mdico que es aceptable usar el transporte pblico.  Llame al Maryln Manuel de P[employer] al [phone] si tiene alguna pregunta sobre estas instrucciones.  Poltica de visitas a la ciruga:  Los [employer] se someten a [place] o procedimiento pueden Database administrator o persona de apoyo con ellos, siempre que esa persona no sea COVID-19 positiva o experimente sus sntomas. Esa persona International aid/development worker en la sala de espera durante el procedimiento y puede rotar con Producer, television/film/video.     ENGLISH VERSION:  Your procedure is scheduled on:  Wednesday, December 14 Report to the [facility], go to the second floor, surgery information desk. To find out your arrival time, please call [phone] between 1PM - 3PM on: Tuesday, December 13  REMEMBER: Instructions that are not followed completely may result in serious medical risk, up to and including death; or upon the discretion of your surgeon and anesthesiologist your surgery may need to be rescheduled.  Do not eat food after midnight the night before surgery.  No gum  chewing, lozengers or hard candies.  You may however, drink CLEAR liquids up to 2 hours before you are scheduled to arrive for your surgery. Do not drink anything within 2 hours of your scheduled arrival time.  Clear liquids include: - water  - apple juice without pulp - gatorade (not RED, PURPLE, OR BLUE) - black coffee or tea (Do NOT add milk or creamers to the coffee or tea) Do NOT drink anything that is not on this list.  TAKE THESE MEDICATIONS THE MORNING OF SURGERY WITH A SIP OF WATER:  Amlodipine  One week prior to surgery: starting December 7 Stop Anti-inflammatories (NSAIDS) such as Advil, Aleve, Ibuprofen, Motrin, Naproxen, Naprosyn and Aspirin based products such as Excedrin, Goodys Powder, BC Powder. Stop ANY OVER THE COUNTER supplements until after surgery. Stop Vitamin D You may however, continue to take Tylenol if needed for pain up until the day of surgery.  No Alcohol for 24 hours before or after surgery.  On the morning of surgery brush your teeth with toothpaste and water, you may rinse your mouth with mouthwash if you wish. Do not swallow any toothpaste or mouthwash.  Use CHG Soap as directed on instruction sheet.  Do not wear jewelry, make-up, hairpins, clips or nail polish.  Do not wear lotions, powders, or perfumes.   Do not shave body from the neck down 48 hours prior to surgery just in case you cut yourself which could leave a site for infection.  Also, freshly shaved skin may become irritated if using the CHG soap.  Do not bring valuables to the hospital. [facility] is not responsible for any missing/lost belongings or valuables.   Notify your doctor if there is any change in your medical condition (cold, fever, infection).  Wear comfortable clothing (specific to your surgery type) to the hospital.  If you are being discharged the day of surgery, you will not be allowed to drive home. You will need a responsible adult (18 years or older) to drive  you home and stay with you that night.   If you are taking public transportation, you will need to have a responsible adult (18 years or older) with you. Please confirm with your physician that it is acceptable to use public transportation.   Please call the [place] Dept. at [phone] if you have any questions about these instructions.  Surgery Visitation Policy:  Patients undergoing a surgery or procedure may have one family member or support person with them as long as that person is not COVID-19 positive or experiencing its symptoms.  That person may remain in the waiting area during the procedure and may rotate out with other people.

## 2021-09-18 ENCOUNTER — Other Ambulatory Visit
Admission: RE | Admit: 2021-09-18 | Discharge: 2021-09-18 | Disposition: A | Discharge status: Home / Self Care | Payer: Medicaid Other | Source: Ambulatory Visit | Attending: General Surgery | Admitting: General Surgery

## 2021-09-23 ENCOUNTER — Ambulatory Visit: Admission: RE | Admit: 2021-09-23 | Payer: Medicaid Other | Source: Home / Self Care | Admitting: General Surgery

## 2021-09-23 ENCOUNTER — Encounter: Admission: RE | Payer: Self-pay | Source: Home / Self Care

## 2021-09-23 SURGERY — REPAIR, HERNIA, INGUINAL, ROBOT-ASSISTED, LAPAROSCOPIC, USING MESH
Anesthesia: General | Site: Groin | Laterality: Right

## 2021-10-22 ENCOUNTER — Ambulatory Visit: Payer: Self-pay | Admitting: General Surgery
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
## 2021-10-28 ENCOUNTER — Other Ambulatory Visit
Admission: RE | Admit: 2021-10-28 | Discharge: 2021-10-28 | Disposition: A | Discharge status: Home / Self Care | Payer: Medicare Other | Source: Ambulatory Visit | Attending: General Surgery | Admitting: General Surgery

## 2021-10-28 ENCOUNTER — Other Ambulatory Visit: Payer: Self-pay

## 2021-10-28 DIAGNOSIS — I10 Essential (primary) hypertension: Secondary | ICD-10-CM | POA: Insufficient documentation

## 2021-10-28 DIAGNOSIS — Z01810 Encounter for preprocedural cardiovascular examination: Secondary | ICD-10-CM | POA: Insufficient documentation

## 2021-10-28 NOTE — Patient Instructions (Signed)
Your procedure is scheduled on: Friday October 30, 2021. Su procedimiento est programado para: Viernes 20 de Enero del 2023. Report to Day Surgery inside [facility] 2nd floor.  Presntese a: Science writer del [facility] 2ndo piso.  To find out your arrival time please call [phone] between 1PM - 3PM on Thursday October 29, 2021. Para saber su hora de llegada por favor llame al [phone] Lyndal Pulley la 1PM - 3PM el da: [address] del 2023.  Remember: Instructions that are not followed completely may result in serious medical risk, up to and including death,  or upon the discretion of your surgeon and anesthesiologist your surgery may need to be rescheduled.  Recuerde: Las instrucciones que no se siguen completamente Heritage manager en un riesgo de salud grave, incluyendo hasta  la [place] o a discrecin de su cirujano y Environmental health practitioner, su ciruga se puede posponer.   __X_ 1.Do not eat food or drink fluids after midnight the night before your procedure. No    gum chewing or hard candies.      No coma nada ni beba liquidos despus de la medianoche de la noche anterior a su    procedimiento. No coma chicles ni caramelos duros. T              _X__ 2.Do Not Smoke or use e-cigarettes For 24 Hours Prior to Your Surgery.    Do not use any chewable tobacco products for at least 6   hours prior to surgery.    No fume ni use cigarrillos electrnicos durante las 24 horas previas    a su [place].  No use ningn producto de tabaco masticable durante   al menos 6 horas antes de la [place].     __X_ 3. No alcohol for 24 hours before or after surgery.    No tome alcohol durante las 24 horas antes ni despus de la [place].   __X__4. On the morning of surgery brush your teeth with toothpaste and water, you                may rinse your mouth with mouthwash if you wish.  Do not swallow any toothpaste of mouthwash.   En la maana de la [place], cepllese los dientes con pasta de  dientes y [place],                [place] enjuagarse la boca con enjuague bucal si lo desea. No ingiera ninguna pasta de dientes o enjuague bucal.   __X__ 5. Notify your doctor if there is any change in your medical condition (cold,fever, infections).    Informe a su mdico si hay algn cambio en su condicin mdica  (resfriado, fiebre, infecciones).   Do not wear jewelry, make-up, hairpins, clips or nail polish.  No use joyas, maquillajes, pinzas/ganchos para el cabello ni esmalte de uas.  Do not wear lotions, powders, or perfumes. You may wear deodorant.  No use lociones, polvos o perfumes.  Puede usar desodorante.    Do not shave 48 hours prior to surgery. Men may shave face and neck.  No se afeite 48 horas antes de la [place].  Los hombres pueden [employer] cara  y el cuello.   Do not bring valuables to the hospital.   No lleve objetos [facility] is not responsible for any belongings or valuables.  [place] no se hace responsable de ningn tipo de pertenencias u objetos de Geographical information systems officer.  Contacts, dentures or bridgework may not be worn into surgery.  Los lentes de [place], las dentaduras postizas o puentes no se pueden usar en la [place].   Leave your suitcase in the car. After surgery it may be brought to your room.  Deje su maleta en el auto.  Despus de la ciruga podr traerla a su habitacin.   For patients admitted to the hospital, discharge time is determined by your  treatment team.  Para los pacientes que sean ingresados al hospital, el tiempo en el cual se le  dar de alta es determinado por su equipo de [place].   Patients discharged the day of surgery will not be allowed to drive home. A los pacientes que se les da de alta el mismo da de la ciruga no se les permitir conducir a Holiday representative.    __X__ Take these medicines the morning of surgery with A SIP OF WATER:          Tome estas medicinas la maana de la ciruga con UN SORBO DE  AGUA:  1. amLODipine (NORVASC) 10 MG   2.   3.   4.       5.  6.  ____ Fleet Enema (as directed)          Enema de Fleet (segn lo indicado)    __X__ Use CHG Soap as directed          Utilice el jabn de CHG segn lo indicado  ____ Use inhalers on the day of surgery          Use los inhaladores el da de la ciruga  ____ Stop metformin 2 days prior to surgery          Deje de tomar el metformin 2 das antes de la ciruga    ____ Take 1/2 of usual insulin dose the night before surgery and none on the morning of surgery           Tome la mitad de la dosis habitual de insulina la noche antes de la [place] y no tome nada en la maana de la             ciruga  __X__ Stop Anti-inflammatories such as Ibuprofen, Aleve, Advil, Motrin, Meloxicam, Ketoralac, Midol, aspirin, Goody's and or BC powders.           Deje de tomar antiinflamatorios como Ibuprofen, Aleve, Advil, Motrin, Meloxicam, Ketoralac, Midol, aspirinas, Goody's and or BC powders.    __X__ Stop supplements until after surgery            Deje de tomar suplementos hasta despus de la ciruga  ____ Bring C-Pap to the hospital          [employer] al hospital

## 2021-10-29 MED ORDER — LACTATED RINGERS IV SOLN: INTRAVENOUS | Status: DC

## 2021-10-29 MED ORDER — CHLORHEXIDINE GLUCONATE 0.12 % MT SOLN: 15.0000 mL | Freq: Once | OROMUCOSAL | Status: AC

## 2021-10-29 MED ORDER — CEFAZOLIN SODIUM-DEXTROSE 2-4 GM/100ML-% IV SOLN
2.0000 g | INTRAVENOUS | Status: AC
  Administered 2021-10-30: 2 g via INTRAVENOUS

## 2021-10-29 MED ORDER — ORAL CARE MOUTH RINSE: 15.0000 mL | Freq: Once | OROMUCOSAL | Status: AC

## 2021-10-29 MED ORDER — FAMOTIDINE 20 MG PO TABS: 20.0000 mg | ORAL_TABLET | Freq: Once | ORAL | Status: AC

## 2021-10-30 ENCOUNTER — Ambulatory Visit: Payer: Medicare Other | Admitting: Anesthesiology

## 2021-10-30 ENCOUNTER — Ambulatory Visit: Payer: BC Managed Care – PPO | Admitting: Radiation Oncology

## 2021-10-30 ENCOUNTER — Ambulatory Visit
Admission: RE | Admit: 2021-10-30 | Discharge: 2021-10-30 | Disposition: A | Discharge status: Home / Self Care | Payer: Medicare Other | Attending: General Surgery | Admitting: General Surgery

## 2021-10-30 ENCOUNTER — Encounter: Payer: Self-pay | Admitting: General Surgery

## 2021-10-30 ENCOUNTER — Encounter
Admission: RE | Disposition: A | Discharge status: Home / Self Care | Payer: Self-pay | Source: Home / Self Care | Attending: General Surgery

## 2021-10-30 ENCOUNTER — Other Ambulatory Visit: Payer: Self-pay

## 2021-10-30 DIAGNOSIS — Z6834 Body mass index (BMI) 34.0-34.9, adult: Secondary | ICD-10-CM | POA: Diagnosis not present

## 2021-10-30 DIAGNOSIS — K219 Gastro-esophageal reflux disease without esophagitis: Secondary | ICD-10-CM | POA: Insufficient documentation

## 2021-10-30 DIAGNOSIS — E669 Obesity, unspecified: Secondary | ICD-10-CM | POA: Diagnosis not present

## 2021-10-30 DIAGNOSIS — K4090 Unilateral inguinal hernia, without obstruction or gangrene, not specified as recurrent: Secondary | ICD-10-CM | POA: Diagnosis present

## 2021-10-30 DIAGNOSIS — I10 Essential (primary) hypertension: Secondary | ICD-10-CM | POA: Diagnosis not present

## 2021-10-30 HISTORY — PX: INSERTION OF MESH: SHX5868

## 2021-10-30 SURGERY — REPAIR, HERNIA, INGUINAL, BILATERAL, ROBOT-ASSISTED
Anesthesia: General | Site: Groin | Laterality: Right

## 2021-10-30 MED ORDER — ROCURONIUM BROMIDE 10 MG/ML (PF) SYRINGE
PREFILLED_SYRINGE | INTRAVENOUS | Status: AC
  Filled 2021-10-30: qty 10

## 2021-10-30 MED ORDER — FENTANYL CITRATE (PF) 100 MCG/2ML IJ SOLN
25.0000 ug | INTRAMUSCULAR | Status: DC | PRN
  Administered 2021-10-30: 50 ug via INTRAVENOUS
  Administered 2021-10-30: 25 ug via INTRAVENOUS

## 2021-10-30 MED ORDER — ONDANSETRON HCL 4 MG/2ML IJ SOLN: 4.0000 mg | Freq: Once | INTRAMUSCULAR | Status: DC | PRN

## 2021-10-30 MED ORDER — LIDOCAINE HCL (PF) 2 % IJ SOLN
INTRAMUSCULAR | Status: AC
  Filled 2021-10-30: qty 5

## 2021-10-30 MED ORDER — FENTANYL CITRATE (PF) 100 MCG/2ML IJ SOLN
INTRAMUSCULAR | Status: AC
  Filled 2021-10-30: qty 2

## 2021-10-30 MED ORDER — BUPIVACAINE-EPINEPHRINE (PF) 0.25% -1:200000 IJ SOLN
INTRAMUSCULAR | Status: AC
  Filled 2021-10-30: qty 30

## 2021-10-30 MED ORDER — ACETAMINOPHEN 10 MG/ML IV SOLN
INTRAVENOUS | Status: AC
  Filled 2021-10-30: qty 100

## 2021-10-30 MED ORDER — HYDROCODONE-ACETAMINOPHEN 5-325 MG PO TABS: 1.0000 | ORAL_TABLET | ORAL | 0 refills | Status: AC | PRN

## 2021-10-30 MED ORDER — LACTATED RINGERS IV SOLN: INTRAVENOUS | Status: DC

## 2021-10-30 MED ORDER — CHLORHEXIDINE GLUCONATE 0.12 % MT SOLN
OROMUCOSAL | Status: AC
  Filled 2021-10-30: qty 15

## 2021-10-30 MED ORDER — CHLORHEXIDINE GLUCONATE 0.12 % MT SOLN
OROMUCOSAL | Status: AC
  Administered 2021-10-30: 15 mL via OROMUCOSAL
  Filled 2021-10-30: qty 15

## 2021-10-30 MED ORDER — PROPOFOL 10 MG/ML IV BOLUS
INTRAVENOUS | Status: AC
  Filled 2021-10-30: qty 20

## 2021-10-30 MED ORDER — MIDAZOLAM HCL 2 MG/2ML IJ SOLN
INTRAMUSCULAR | Status: DC | PRN
Start: 2021-10-30 — End: 2021-10-30
  Administered 2021-10-30: 2 mg via INTRAVENOUS

## 2021-10-30 MED ORDER — FENTANYL CITRATE (PF) 100 MCG/2ML IJ SOLN
INTRAMUSCULAR | Status: DC | PRN
  Administered 2021-10-30 (×2): 50 ug via INTRAVENOUS

## 2021-10-30 MED ORDER — ACETAMINOPHEN 10 MG/ML IV SOLN
INTRAVENOUS | Status: DC | PRN
  Administered 2021-10-30: 1000 mg via INTRAVENOUS

## 2021-10-30 MED ORDER — HYDROMORPHONE HCL 1 MG/ML IJ SOLN
INTRAMUSCULAR | Status: DC | PRN
Start: 2021-10-30 — End: 2021-10-30
  Administered 2021-10-30: .5 mg via INTRAVENOUS

## 2021-10-30 MED ORDER — BUPIVACAINE-EPINEPHRINE 0.25% -1:200000 IJ SOLN
INTRAMUSCULAR | Status: DC | PRN
  Administered 2021-10-30: 30 mL

## 2021-10-30 MED ORDER — KETOROLAC TROMETHAMINE 30 MG/ML IJ SOLN
INTRAMUSCULAR | Status: DC | PRN
Start: 2021-10-30 — End: 2021-10-30
  Administered 2021-10-30: 30 mg via INTRAVENOUS

## 2021-10-30 MED ORDER — FAMOTIDINE 20 MG PO TABS
ORAL_TABLET | ORAL | Status: AC
  Administered 2021-10-30: 20 mg via ORAL
  Filled 2021-10-30: qty 1

## 2021-10-30 MED ORDER — FENTANYL CITRATE (PF) 100 MCG/2ML IJ SOLN
INTRAMUSCULAR | Status: AC
  Administered 2021-10-30: 50 ug via INTRAVENOUS
  Filled 2021-10-30: qty 2

## 2021-10-30 MED ORDER — HYDROMORPHONE HCL 1 MG/ML IJ SOLN
INTRAMUSCULAR | Status: AC
  Filled 2021-10-30: qty 1

## 2021-10-30 MED ORDER — LIDOCAINE HCL (CARDIAC) PF 100 MG/5ML IV SOSY
PREFILLED_SYRINGE | INTRAVENOUS | Status: DC | PRN
  Administered 2021-10-30: 80 mg via INTRAVENOUS

## 2021-10-30 MED ORDER — OXYCODONE HCL 5 MG PO TABS: 5.0000 mg | ORAL_TABLET | Freq: Once | ORAL | Status: DC | PRN

## 2021-10-30 MED ORDER — DEXAMETHASONE SODIUM PHOSPHATE 10 MG/ML IJ SOLN
INTRAMUSCULAR | Status: DC | PRN
  Administered 2021-10-30: 10 mg via INTRAVENOUS

## 2021-10-30 MED ORDER — ONDANSETRON HCL 4 MG/2ML IJ SOLN
INTRAMUSCULAR | Status: AC
  Filled 2021-10-30: qty 2

## 2021-10-30 MED ORDER — PROPOFOL 10 MG/ML IV BOLUS
INTRAVENOUS | Status: DC | PRN
Start: 2021-10-30 — End: 2021-10-30
  Administered 2021-10-30: 160 mg via INTRAVENOUS

## 2021-10-30 MED ORDER — ONDANSETRON HCL 4 MG/2ML IJ SOLN
INTRAMUSCULAR | Status: DC | PRN
Start: 2021-10-30 — End: 2021-10-30
  Administered 2021-10-30: 4 mg via INTRAVENOUS

## 2021-10-30 MED ORDER — DEXAMETHASONE SODIUM PHOSPHATE 10 MG/ML IJ SOLN
INTRAMUSCULAR | Status: AC
  Filled 2021-10-30: qty 1

## 2021-10-30 MED ORDER — FENTANYL CITRATE (PF) 100 MCG/2ML IJ SOLN
INTRAMUSCULAR | Status: AC
  Administered 2021-10-30: 25 ug via INTRAVENOUS
  Filled 2021-10-30: qty 2

## 2021-10-30 MED ORDER — FAMOTIDINE 20 MG PO TABS
ORAL_TABLET | ORAL | Status: AC
  Filled 2021-10-30: qty 1

## 2021-10-30 MED ORDER — CEFAZOLIN SODIUM-DEXTROSE 2-4 GM/100ML-% IV SOLN
INTRAVENOUS | Status: AC
  Filled 2021-10-30: qty 100

## 2021-10-30 MED ORDER — ACETAMINOPHEN 10 MG/ML IV SOLN: 1000.0000 mg | Freq: Once | INTRAVENOUS | Status: DC | PRN

## 2021-10-30 MED ORDER — MIDAZOLAM HCL 2 MG/2ML IJ SOLN
INTRAMUSCULAR | Status: AC
  Filled 2021-10-30: qty 2

## 2021-10-30 MED ORDER — PHENYLEPHRINE 40 MCG/ML (10ML) SYRINGE FOR IV PUSH (FOR BLOOD PRESSURE SUPPORT)
PREFILLED_SYRINGE | INTRAVENOUS | Status: DC | PRN
Start: 2021-10-30 — End: 2021-10-30
  Administered 2021-10-30: 80 ug via INTRAVENOUS
  Administered 2021-10-30: 160 ug via INTRAVENOUS
  Administered 2021-10-30 (×2): 80 ug via INTRAVENOUS

## 2021-10-30 MED ORDER — OXYCODONE HCL 5 MG/5ML PO SOLN: 5.0000 mg | Freq: Once | ORAL | Status: DC | PRN

## 2021-10-30 MED ORDER — KETOROLAC TROMETHAMINE 30 MG/ML IJ SOLN
INTRAMUSCULAR | Status: AC
  Filled 2021-10-30: qty 1

## 2021-10-30 MED ORDER — ROCURONIUM BROMIDE 100 MG/10ML IV SOLN
INTRAVENOUS | Status: DC | PRN
  Administered 2021-10-30 (×2): 10 mg via INTRAVENOUS
  Administered 2021-10-30: 40 mg via INTRAVENOUS

## 2021-10-30 SURGICAL SUPPLY — 49 items
ADH SKN CLS APL DERMABOND .7 (GAUZE/BANDAGES/DRESSINGS) ×2
BAG INFUSER PRESSURE 100CC (MISCELLANEOUS) IMPLANT
BLADE SURG SZ11 CARB STEEL (BLADE) ×3 IMPLANT
COVER TIP SHEARS 8 DVNC (MISCELLANEOUS) ×2 IMPLANT
COVER TIP SHEARS 8MM DA VINCI (MISCELLANEOUS) ×2
COVER WAND RF STERILE (DRAPES) ×3 IMPLANT
DERMABOND ADVANCED (GAUZE/BANDAGES/DRESSINGS) ×1
DERMABOND ADVANCED .7 DNX12 (GAUZE/BANDAGES/DRESSINGS) ×2 IMPLANT
DRAPE ARM DVNC X/XI (DISPOSABLE) ×6 IMPLANT
DRAPE COLUMN DVNC XI (DISPOSABLE) ×2 IMPLANT
DRAPE DA VINCI XI ARM (DISPOSABLE) ×3
DRAPE DA VINCI XI COLUMN (DISPOSABLE) ×2
ELECT REM PT RETURN 9FT ADLT (ELECTROSURGICAL) ×3
ELECTRODE REM PT RTRN 9FT ADLT (ELECTROSURGICAL) ×2 IMPLANT
GLOVE SURG ENC MOIS LTX SZ6.5 (GLOVE) ×9 IMPLANT
GLOVE SURG UNDER POLY LF SZ6.5 (GLOVE) ×9 IMPLANT
GOWN STRL REUS W/ TWL LRG LVL3 (GOWN DISPOSABLE) ×6 IMPLANT
GOWN STRL REUS W/TWL LRG LVL3 (GOWN DISPOSABLE) ×15
IRRIGATOR SUCT 8 DISP DVNC XI (IRRIGATION / IRRIGATOR) IMPLANT
IRRIGATOR SUCTION 8MM XI DISP (IRRIGATION / IRRIGATOR)
IV CATH ANGIO 12GX3 LT BLUE (NEEDLE) IMPLANT
IV NS 1000ML (IV SOLUTION)
IV NS 1000ML BAXH (IV SOLUTION) IMPLANT
KIT PINK PAD W/HEAD ARE REST (MISCELLANEOUS) ×3
KIT PINK PAD W/HEAD ARM REST (MISCELLANEOUS) ×2 IMPLANT
LABEL OR SOLS (LABEL) IMPLANT
MANIFOLD NEPTUNE II (INSTRUMENTS) ×3 IMPLANT
MESH 3DMAX 5X7 RT XLRG (Mesh General) ×1 IMPLANT
MESH 3DMAX MID 5X7 RT XLRG (Mesh General) IMPLANT
NDL INSUFFLATION 14GA 120MM (NEEDLE) ×2 IMPLANT
NEEDLE HYPO 22GX1.5 SAFETY (NEEDLE) ×3 IMPLANT
NEEDLE INSUFFLATION 14GA 120MM (NEEDLE) ×3 IMPLANT
OBTURATOR OPTICAL STANDARD 8MM (TROCAR) ×1
OBTURATOR OPTICAL STND 8 DVNC (TROCAR) ×2
OBTURATOR OPTICALSTD 8 DVNC (TROCAR) ×2 IMPLANT
PACK LAP CHOLECYSTECTOMY (MISCELLANEOUS) ×3 IMPLANT
SEAL CANN UNIV 5-8 DVNC XI (MISCELLANEOUS) ×6 IMPLANT
SEAL XI 5MM-8MM UNIVERSAL (MISCELLANEOUS) ×3
SET TUBE SMOKE EVAC HIGH FLOW (TUBING) ×3 IMPLANT
SOLUTION ELECTROLUBE (MISCELLANEOUS) ×3 IMPLANT
SUT MNCRL 4-0 (SUTURE) ×3
SUT MNCRL 4-0 27XMFL (SUTURE) ×2
SUT VIC AB 2-0 SH 27 (SUTURE) ×3
SUT VIC AB 2-0 SH 27XBRD (SUTURE) ×2 IMPLANT
SUT VLOC 90 S/L VL9 GS22 (SUTURE) ×3 IMPLANT
SUTURE MNCRL 4-0 27XMF (SUTURE) ×2 IMPLANT
TAPE TRANSPORE STRL 2 31045 (GAUZE/BANDAGES/DRESSINGS) IMPLANT
TRAY FOLEY MTR SLVR 16FR STAT (SET/KITS/TRAYS/PACK) ×2 IMPLANT
WATER STERILE IRR 500ML POUR (IV SOLUTION) ×2 IMPLANT

## 2021-10-30 NOTE — Anesthesia Procedure Notes (Signed)
Procedure Name: Intubation Date/Time: 10/30/2021 8:46 AM Performed by: Loletha Grayer, CRNA Pre-anesthesia Checklist: Patient identified, Patient being monitored, Timeout performed, Emergency Drugs available and Suction available Patient Re-evaluated:Patient Re-evaluated prior to induction Oxygen Delivery Method: Circle system utilized Preoxygenation: Pre-oxygenation with 100% oxygen Induction Type: IV induction Ventilation: Mask ventilation without difficulty Laryngoscope Size: 3 and McGraph Grade View: Grade I Tube type: Oral Tube size: 7.0 mm Number of attempts: 1 Airway Equipment and Method: Stylet Placement Confirmation: ETT inserted through vocal cords under direct vision, positive ETCO2 and breath sounds checked- equal and bilateral Secured at: 19 cm Tube secured with: Tape Dental Injury: Teeth and Oropharynx as per pre-operative assessment

## 2021-10-30 NOTE — Interval H&P Note (Signed)
History and Physical Interval Note:  10/30/2021 8:22 AM  Rebekah Kelly  has presented today for surgery, with the diagnosis of K40.90 unilateral inguinal hernia w/o obstruction or gangrene.  The various methods of treatment have been discussed with the patient and family. After consideration of risks, benefits and other options for treatment, the patient has consented to  Procedure(s): XI ROBOTIC ASSISTED BILATERAL INGUINAL HERNIA (Bilateral) as a surgical intervention.  The patient's history has been reviewed, patient examined, no change in status, stable for surgery.  I have reviewed the patient's chart and labs.  Questions were answered to the patient's satisfaction.     Herbert Pun

## 2021-10-30 NOTE — OR Nursing (Signed)
Per Dr. Windell Moment secure chat, pt does not have to void postop prior to discharge, states "she did already in the ER)

## 2021-10-30 NOTE — Anesthesia Preprocedure Evaluation (Addendum)
Anesthesia Evaluation  Patient identified by MRN, date of birth, ID band Patient awake    Reviewed: Allergy & Precautions, NPO status , Patient's Chart, lab work & pertinent test results  History of Anesthesia Complications Negative for: history of anesthetic complications  Airway Mallampati: IV   Neck ROM: Full    Dental no notable dental hx.    Pulmonary neg pulmonary ROS,    Pulmonary exam normal breath sounds clear to auscultation       Cardiovascular hypertension, Normal cardiovascular exam Rhythm:Regular Rate:Normal  ECG 10/28/21:  Sinus rhythm with Premature atrial complexes Nonspecific ST and T wave abnormality   Neuro/Psych PSYCHIATRIC DISORDERS Anxiety negative neurological ROS     GI/Hepatic GERD  ,  Endo/Other  Obesity   Renal/GU negative Renal ROS     Musculoskeletal   Abdominal   Peds  Hematology negative hematology ROS (+)   Anesthesia Other Findings   Reproductive/Obstetrics                            Anesthesia Physical Anesthesia Plan  ASA: 2  Anesthesia Plan: General   Post-op Pain Management:    Induction: Intravenous  PONV Risk Score and Plan: 3 and Ondansetron, Dexamethasone and Treatment may vary due to age or medical condition  Airway Management Planned: Oral ETT  Additional Equipment:   Intra-op Plan:   Post-operative Plan: Extubation in OR  Informed Consent: I have reviewed the patients History and Physical, chart, labs and discussed the procedure including the risks, benefits and alternatives for the proposed anesthesia with the patient or authorized representative who has indicated his/her understanding and acceptance.     Dental advisory given  Plan Discussed with: CRNA  Anesthesia Plan Comments: (Patient consented in [place].  Patient consented for risks of anesthesia including but not limited to:  - adverse reactions to medications -  damage to eyes, teeth, lips or other oral mucosa - nerve damage due to positioning  - sore throat or hoarseness - damage to heart, brain, nerves, lungs, other parts of body or loss of life  Informed patient about role of CRNA in peri- and intra-operative care.  Patient voiced understanding.)       Anesthesia Quick Evaluation

## 2021-10-30 NOTE — Transfer of Care (Signed)
Immediate Anesthesia Transfer of Care Note  Patient: Rebekah Kelly  Procedure(s) Performed: XI ROBOTIC ASSISTED Right  INGUINAL HERNIA (Right: Groin) INSERTION OF MESH  Patient Location: PACU  Anesthesia Type:General  Level of Consciousness: awake  Airway & Oxygen Therapy: Patient Spontanous Breathing and Patient connected to face mask oxygen  Post-op Assessment: Report given to RN and Post -op Vital signs reviewed and stable  Post vital signs: Reviewed and stable  Last Vitals:  Vitals Value Taken Time  BP 125/66 10/30/21 1033  Temp 36.7 C 10/30/21 1033  Pulse 93 10/30/21 1040  Resp 17 10/30/21 1040  SpO2 98 % 10/30/21 1040    Last Pain:  Vitals:   10/30/21 1033  TempSrc:   PainSc: Asleep         Complications: No notable events documented.

## 2021-10-30 NOTE — Discharge Instructions (Addendum)
  Diet: Resume home heart healthy regular diet.   Activity: No heavy lifting >20 pounds (children, pets, laundry, garbage) or strenuous activity until follow-up, but light activity and walking are encouraged. Do not drive or drink alcohol if taking narcotic pain medications.  Wound care: May shower with soapy water and pat dry (do not rub incisions), but no baths or submerging incision underwater until follow-up. (no swimming)   Medications: Resume all home medications. For mild to moderate pain: acetaminophen (Tylenol) ***or ibuprofen (if no kidney disease). Combining Tylenol with alcohol can substantially increase your risk of causing liver disease. Narcotic pain medications, if prescribed, can be used for severe pain, though may cause nausea, constipation, and drowsiness. Do not combine Tylenol and Norco within a 6 hour period as Norco contains Tylenol. If you do not need the narcotic pain medication, you do not need to fill the prescription.  Call office ([phone]) at any time if any questions, worsening pain, fevers/chills, bleeding, drainage from incision site, or other concerns.   AMBULATORY SURGERY  DISCHARGE INSTRUCTIONS   The drugs that you were given will stay in your system until tomorrow so for the next 24 hours you should not:  Drive an automobile Make any legal decisions Drink any alcoholic beverage   You may resume regular meals tomorrow.  Today it is better to start with liquids and gradually work up to solid foods.  You may eat anything you prefer, but it is better to start with liquids, then soup and crackers, and gradually work up to solid foods.   Please notify your doctor immediately if you have any unusual bleeding, trouble breathing, redness and pain at the surgery site, drainage, fever, or pain not relieved by medication.    Additional Instructions:        Please contact your physician with any problems or Same Day Surgery at [phone], Monday  through Friday 6 am to 4 pm, or  at [facility] number at [phone].  

## 2021-10-30 NOTE — Anesthesia Postprocedure Evaluation (Signed)
Anesthesia Post Note  Patient: Rebekah Kelly  Procedure(s) Performed: XI ROBOTIC ASSISTED Right  INGUINAL HERNIA (Right: Groin) INSERTION OF MESH  Patient location during evaluation: PACU Anesthesia Type: General Level of consciousness: awake and alert, oriented and patient cooperative Pain management: pain level controlled Vital Signs Assessment: post-procedure vital signs reviewed and stable Respiratory status: spontaneous breathing, nonlabored ventilation and respiratory function stable Cardiovascular status: blood pressure returned to baseline and stable Postop Assessment: adequate PO intake Anesthetic complications: no   No notable events documented.   Last Vitals:  Vitals:   10/30/21 1158 10/30/21 1227  BP: 134/65 (!) 142/72  Pulse: (!) 101 (!) 101  Resp: 16 16  Temp: (!) 36.4 C   SpO2: 97% 96%    Last Pain:  Vitals:   10/30/21 1227  TempSrc:   PainSc: 2                  Darrin Nipper

## 2021-10-30 NOTE — Op Note (Signed)
Preoperative diagnosis: Right inguinal hernia.   Postoperative diagnosis: Right inguinal hernia.  Procedure: Robotic assisted Laparoscopic Transabdominal preperitoneal laparoscopic (TAPP) repair of right inguinal hernia.  Anesthesia: GETA  Surgeon: Dr. Windell Moment  Wound Classification: Clean  Indications:  Patient is a 71 y.o. female developed a right inguinal hernia. Repair was indicated.  Findings: 1. Right indirect Inguinal hernia identified 2. Round ligament identified and preserved 3. Bard Extra Large 3D Max MID Anatomical mesh used for repair 4. Adequate hemostasis.   Description of procedure: The patient was taken to the operating room and the correct side of surgery was verified. The patient was placed supine with arms tucked at the sides. After obtaining adequate anesthesia, the patients abdomen was prepped and draped in standard sterile fashion. The patient was placed in the Trendelenburg position. A time-out was completed verifying correct patient, procedure, site, positioning, and implant(s) and/or special equipment prior to beginning this procedure. A Veress needle was placed at the umbilicus and pneumoperitoneum created with insufflation of carbon dioxide to 15 mmHg. After the Veress needle was removed, an 8-mm trocar was placed on epigastric area and the 30 angled laparoscope inserted. Two 8-mm trocars were then placed lateral to the rectus sheath under direct visualization. Both inguinal regions were inspected and the median umbilical ligament, medial umbilical ligament, and lateral umbilical fold were identified.  The robotic arms were docked. The robotic scope was inserted and the pelvic area anatomy targeted.  The peritoneum was incised with scissors along a line 6 cm above the superior edge of the hernia defect, extending from the median umbilical ligament to the anterior superior iliac spine. The peritoneal flap was mobilized inferiorly using blunt and sharp dissection.  The inferior epigastric vessels were exposed and the pubic symphysis was identified. Coopers ligament was dissected to its junction with the iliac vein. The dissection was continued inferiorly to the iliopubic tract, with care taken to avoid injury to the femoral branch of the genitofemoral nerve and the lateral femoral cutaneous nerve. The cord structures were parietalized. The hernia was identified and reduced by gentle traction.  The hernia sac was noted mobilized from the round ligament and reduced into the peritoneal cavity.  An extra large piece of mesh was rolled longitudinally into a compact cylinder and passed through a trocar. The cylinder was placed along the inferior aspect of the working space and unrolled into place to completely cover the direct, indirect, and femoral spaces. The mesh was secured into place superiorly to the anterior abdominal wall and inferiorly and medially to Coopers ligament with absorbable sutures. Care was taken to avoid the inferolateral triangles containing the iliac vessels and genital nerves. The peritoneal flap was closed over the mesh and secured with suture in similar positions of safety. After ensuring adequate hemostasis, the trocars were removed and the pneumoperitoneum allowed to escape. The trocar incisions were closed using monocryl and skin adhesive dressings applied.  The patient tolerated the procedure well and was taken to the postanesthesia care unit in stable condition.   Specimen: None  Complications: None  Estimated Blood Loss: 5 mL

## 2021-10-30 NOTE — Progress Notes (Signed)
°   10/30/21 0800  Clinical Encounter Type  Visited With Patient  Visit Type Pre-op;Spiritual support   Chaplain visited with patient in [place]. Chaplain provided support through conversation and prayer and compassionate presence.

## 2021-11-02 ENCOUNTER — Encounter: Payer: Self-pay | Admitting: General Surgery

## 2021-11-20 ENCOUNTER — Encounter: Payer: Self-pay | Admitting: Radiation Oncology

## 2021-11-20 ENCOUNTER — Ambulatory Visit
Admission: RE | Admit: 2021-11-20 | Discharge: 2021-11-20 | Disposition: A | Discharge status: Home / Self Care | Payer: Medicare Other | Source: Ambulatory Visit | Attending: Radiation Oncology | Admitting: Radiation Oncology

## 2021-11-20 ENCOUNTER — Other Ambulatory Visit: Payer: Self-pay

## 2021-11-20 VITALS — Resp 18 | Wt 162.6 lb

## 2021-11-20 DIAGNOSIS — Z86000 Personal history of in-situ neoplasm of breast: Secondary | ICD-10-CM | POA: Diagnosis present

## 2021-11-20 DIAGNOSIS — Z923 Personal history of irradiation: Secondary | ICD-10-CM | POA: Insufficient documentation

## 2021-11-20 DIAGNOSIS — C50411 Malignant neoplasm of upper-outer quadrant of right female breast: Secondary | ICD-10-CM

## 2021-11-20 DIAGNOSIS — Z171 Estrogen receptor negative status [ER-]: Secondary | ICD-10-CM

## 2021-11-20 NOTE — Progress Notes (Signed)
Radiation Oncology Follow up Note  Name: Rebekah Kelly   Date:   11/20/2021 MRN:  [id number] DOB: 1951-03-09    This 71 y.o. 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  Interpreter was present throughout my examination.  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  Seen today in routine follow-up she is doing well.  She specifically denies breast tenderness cough or bone pain..  She had mammograms back in June which I have reviewed were BI-RADS 1 negative.  She is not on aromatase inhibitor based on the ER/PR negative nature of her disease.  COMPLICATIONS OF TREATMENT: none  FOLLOW UP COMPLIANCE: keeps appointments   PHYSICAL EXAM:  Resp 18    Wt 162 lb 9.6 oz (73.8 kg)    BMI 35.19 kg/m  Lungs are clear to A&P cardiac examination essentially unremarkable with regular rate and rhythm. No dominant mass or nodularity is noted in either breast in 2 positions examined. Incision is well-healed. No axillary or supraclavicular adenopathy is appreciated. Cosmetic result is excellent.  Well-developed well-nourished patient in NAD. HEENT reveals PERLA, EOMI, discs not visualized.  Oral cavity is clear. No oral mucosal lesions are identified. Neck is clear without evidence of cervical or supraclavicular adenopathy. Lungs are clear to A&P. Cardiac examination is essentially unremarkable with regular rate and rhythm without murmur rub or thrill. Abdomen is benign with no organomegaly or masses noted. Motor sensory and DTR levels are equal and symmetric in the upper and lower extremities. Cranial nerves II through XII are grossly intact. Proprioception is intact. No peripheral adenopathy or edema is identified. No  motor or sensory levels are noted. Crude visual fields are within normal range.  RADIOLOGY RESULTS: Mammograms reviewed compatible with above-stated findings  PLAN: Present time patient is doing well now out of over 4 years with no evidence of disease.  I am going to turn follow-up care over to her PMD who has been ordering her mammograms.  But happy to reevaluate the patient anytime should further consultation be indicated.  Patient and family know to call with any concerns.  I would like to take this opportunity to thank you for allowing me to participate in the care of your patient.Noreene Filbert, MD

## 2022-02-23 ENCOUNTER — Other Ambulatory Visit: Payer: Self-pay | Admitting: Internal Medicine

## 2022-02-23 DIAGNOSIS — Z1231 Encounter for screening mammogram for malignant neoplasm of breast: Secondary | ICD-10-CM

## 2022-03-26 ENCOUNTER — Ambulatory Visit
Admission: RE | Admit: 2022-03-26 | Discharge: 2022-03-26 | Disposition: A | Discharge status: Home / Self Care | Payer: Medicare Other | Source: Ambulatory Visit | Attending: Internal Medicine | Admitting: Internal Medicine

## 2022-03-26 DIAGNOSIS — Z1231 Encounter for screening mammogram for malignant neoplasm of breast: Secondary | ICD-10-CM | POA: Insufficient documentation

## 2023-02-28 ENCOUNTER — Other Ambulatory Visit: Payer: Self-pay

## 2023-02-28 DIAGNOSIS — Z1231 Encounter for screening mammogram for malignant neoplasm of breast: Secondary | ICD-10-CM

## 2023-04-13 ENCOUNTER — Ambulatory Visit
Admission: RE | Admit: 2023-04-13 | Discharge: 2023-04-13 | Disposition: A | Discharge status: Home / Self Care | Payer: 59 | Source: Ambulatory Visit | Attending: Internal Medicine | Admitting: Internal Medicine

## 2023-04-13 DIAGNOSIS — Z1231 Encounter for screening mammogram for malignant neoplasm of breast: Secondary | ICD-10-CM | POA: Insufficient documentation

## 2024-03-06 ENCOUNTER — Other Ambulatory Visit: Payer: Self-pay | Admitting: Internal Medicine

## 2024-03-06 DIAGNOSIS — Z1231 Encounter for screening mammogram for malignant neoplasm of breast: Secondary | ICD-10-CM

## 2024-04-18 ENCOUNTER — Ambulatory Visit
Admission: RE | Admit: 2024-04-18 | Discharge: 2024-04-18 | Disposition: A | Discharge status: Home / Self Care | Source: Ambulatory Visit | Attending: Internal Medicine | Admitting: Internal Medicine

## 2024-04-18 DIAGNOSIS — Z1231 Encounter for screening mammogram for malignant neoplasm of breast: Secondary | ICD-10-CM | POA: Diagnosis present
